# Patient Record
Sex: Male | Born: 1950 | Race: White | Hispanic: No | Marital: Single
Health system: Southern US, Community
[De-identification: ages and names within clinical notes are randomized; demographics above are authoritative.]

## PROBLEM LIST (undated history)

## (undated) DIAGNOSIS — J302 Other seasonal allergic rhinitis: Secondary | ICD-10-CM

## (undated) DIAGNOSIS — M1990 Unspecified osteoarthritis, unspecified site: Secondary | ICD-10-CM

## (undated) DIAGNOSIS — Z87442 Personal history of urinary calculi: Secondary | ICD-10-CM

## (undated) DIAGNOSIS — I10 Essential (primary) hypertension: Secondary | ICD-10-CM

## (undated) HISTORY — PX: EYE SURGERY: SHX253

## (undated) HISTORY — PX: SHOULDER SURGERY: SHX246

## (undated) HISTORY — PX: ANKLE SURGERY: SHX546

---

## 2010-04-21 ENCOUNTER — Emergency Department (HOSPITAL_COMMUNITY): Admission: EM | Admit: 2010-04-21 | Discharge: 2010-04-21 | Payer: Self-pay | Admitting: Emergency Medicine

## 2010-12-27 ENCOUNTER — Emergency Department (HOSPITAL_COMMUNITY)
Admission: EM | Admit: 2010-12-27 | Discharge: 2010-12-27 | Disposition: A | Discharge status: Home / Self Care | Payer: Worker's Compensation | Attending: Emergency Medicine | Admitting: Emergency Medicine

## 2010-12-27 ENCOUNTER — Emergency Department (HOSPITAL_COMMUNITY): Payer: Worker's Compensation

## 2010-12-27 DIAGNOSIS — Y9269 Other specified industrial and construction area as the place of occurrence of the external cause: Secondary | ICD-10-CM | POA: Insufficient documentation

## 2010-12-27 DIAGNOSIS — F17200 Nicotine dependence, unspecified, uncomplicated: Secondary | ICD-10-CM | POA: Insufficient documentation

## 2010-12-27 DIAGNOSIS — W208XXA Other cause of strike by thrown, projected or falling object, initial encounter: Secondary | ICD-10-CM | POA: Insufficient documentation

## 2010-12-27 DIAGNOSIS — S62639B Displaced fracture of distal phalanx of unspecified finger, initial encounter for open fracture: Secondary | ICD-10-CM | POA: Insufficient documentation

## 2010-12-27 DIAGNOSIS — S6710XA Crushing injury of unspecified finger(s), initial encounter: Secondary | ICD-10-CM | POA: Insufficient documentation

## 2010-12-27 DIAGNOSIS — Y990 Civilian activity done for income or pay: Secondary | ICD-10-CM | POA: Insufficient documentation

## 2010-12-27 DIAGNOSIS — Z23 Encounter for immunization: Secondary | ICD-10-CM | POA: Insufficient documentation

## 2010-12-30 NOTE — Consult Note (Signed)
NAME:  Edward Lawson, Edward Lawson NO.:  [id number]  MEDICAL RECORD NO.:  [id number]           PATIENT TYPE:  E  LOCATION:  WLED                         FACILITY:  [facility]  PHYSICIAN:  Betha Loa, MD        DATE OF BIRTH:  [birth year]  DATE OF CONSULTATION:  12/27/2010 DATE OF DISCHARGE:  12/27/2010                                CONSULTATION   Consult is from [facility] Emergency Department.  Consult is for left long fingertip crush injury.  HISTORY:  Mr. 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  He was adjusting a piece of metal and it slipped and pinched the tip of the left long finger.  He noted avulsion of the nail from the nail fold and laceration at the tip of the finger.  He came to [facility] Emergency Department for further care.  He reports no previous injuries to the left long finger or no other injuries at this time.  He was evaluated by the emergency department staff and radiographs taken.  His tetanus was updated.  He was given a gram of IV Ancef for coverage for his open fracture.  I was consulted for management of injury.  ALLERGIES:  No known drug allergies.  PAST MEDICAL HISTORY:  None.  PAST SURGICAL HISTORY:  Eye surgery as a child.  MEDICATIONS:  None.  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  He smokes one and half packs of tobacco per day for 20 years and drinks alcohol twice per week.  FAMILY HISTORY:  Noncontributory.  REVIEW SYSTEMS:  Twelve-point review of systems is negative.  PHYSICAL EXAMINATION:  VITAL SIGNS:  Temperature 98.2, pulse 73, respirations 16, blood pressure 149/80. HEAD:  Normocephalic, atraumatic. NECK:  Supple, full range of motion. EXTREMITIES:  Bilateral upper extremities are distally neurovascularly intact in radial, median, and ulnar nerve distributions. There is good sensation and capillary refill in all fingertips.  He  has had a digital block of the left long finger, but states he can still feel both the radial and ulnar sides at the tip of the finger.  Right upper extremity has no wounds, no tenderness to palpation.  Left upper extremity has no wounds, no tenderness to palpation with exception of long finger.  In the long finger, the nail is avulsed from the nail fold, there is a laceration on the radial side of the finger.  There is exposed bone.  He is able to fully extend and flex the DIP and PIP joints.  RADIOGRAPHS:  AP, lateral, and oblique views of left hand show tuft fracture of the left long finger.  No other fractures, dislocations, radiopaque foreign bodies are noted.  ASSESSMENT AND PLAN:  Left long fingertip crush injury.  I discussed with Mr. Lacasse the nature of his injury.  I recommended irrigation, debridement, and repair of the wound in the emergency department. Risks, benefits, and alternative doing so were discussed, including risk of blood loss; infection; damage to nerves, vessels, tendons, ligaments, bone; need for  additional procedures; complications with wound healing; nonunion and malunion of the fracture; and osteomyelitis.  He voiced understanding these risks and elected to proceed.  PROCEDURE NOTE:  Digital block was performed with 10 cc of 2% plain lidocaine and 5 cc of 0.25% plain Marcaine.  This was adequate to give total digital anesthesia.  The wound was irrigated with 500 cc of sterile saline and Betadine solution.  The long finger was then prepped with Betadine and draped with sterile towels.  A Penrose drain was used and tourniquet was up for less than 30 minutes.  The fingernail was removed using a freer elevator.  The wound was explored.  There was a couple of pieces of metal shaving in the wound that were removed.  The wound was debrided.  No other gross contamination was noted.  The skin laceration was repaired with 5-0 Monocryl suture in interrupted  fashion. Nail bed was repaired with 6-0 chromic gut suture in an interrupted fashion.  There was one portion of the nail bed centrally that had been shaved off.  This was able to be repaired back onto the nail bed and secured again with 6-0 chromic gut suture.  All skin edges and nail bed edges were opposed well.  A piece of Xeroform was then placed in the nail fold and all wounds were dressed with sterile Xeroform.  They were then dressed with sterile 2 x 2's and wrapped with Kling and a Coban dressing lightly.  Penrose drain was removed.  I will give him Percocet 5/325 mg 1-2 p.o. q.6 h. p.r.n. pain and dispensed #40 and Bactrim DS 1 p.o. b.i.d. for 7 days.  I will see him back in the office in 1 week for postprocedure followup.     Betha Loa, MD     KK/MEDQ  D:  12/27/2010  T:  12/28/2010  Job:  [id number]  Electronically Signed by Betha Loa  on 12/30/2010 04:15:12 PM

## 2016-05-24 ENCOUNTER — Ambulatory Visit (INDEPENDENT_AMBULATORY_CARE_PROVIDER_SITE_OTHER): Payer: Worker's Compensation

## 2016-05-24 ENCOUNTER — Ambulatory Visit (INDEPENDENT_AMBULATORY_CARE_PROVIDER_SITE_OTHER): Payer: Worker's Compensation | Admitting: Physician Assistant

## 2016-05-24 VITALS — BP 150/82 | HR 80 | Temp 98.3°F | Resp 16 | Wt 185.0 lb

## 2016-05-24 DIAGNOSIS — Z23 Encounter for immunization: Secondary | ICD-10-CM | POA: Diagnosis not present

## 2016-05-24 DIAGNOSIS — T148XXA Other injury of unspecified body region, initial encounter: Secondary | ICD-10-CM

## 2016-05-24 DIAGNOSIS — S62664B Nondisplaced fracture of distal phalanx of right ring finger, initial encounter for open fracture: Secondary | ICD-10-CM | POA: Diagnosis not present

## 2016-05-24 DIAGNOSIS — S61214A Laceration without foreign body of right ring finger without damage to nail, initial encounter: Secondary | ICD-10-CM | POA: Diagnosis not present

## 2016-05-24 DIAGNOSIS — Z026 Encounter for examination for insurance purposes: Secondary | ICD-10-CM

## 2016-05-24 MED ORDER — TRAMADOL HCL 50 MG PO TABS: 50.0000 mg | ORAL_TABLET | Freq: Three times a day (TID) | ORAL | 0 refills | Status: DC | PRN

## 2016-05-24 NOTE — Progress Notes (Signed)
Edward Lawson 10/03/1950 65 y.o.   Chief Complaint  Patient presents with  . Hand Pain    smashed right ring finger in machine at work     Date of Injury: October 18., 2018  History of Present Illness:  Presents for evaluation of work-related complaint. 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 His ring finger on his right hand was caught and crushed in the belt. He has a laceration and notes the tip of his finger wiggles when he moves it.  No decreased ROM with bending/flexing. Notes mild numbness and tingling to tip of finger. Cannot recall his last Tdap vaccination.   Review of Systems  Constitutional: Negative.   Cardiovascular: Negative for chest pain and palpitations.  Musculoskeletal: Positive for joint pain (Finger laceration right ring finger).  Neurological: Positive for sensory change (numbness right ring finger. ). Negative for dizziness and tingling.    No Known Allergies  Current medications reviewed and updated. Past medical history, family history, social history have been reviewed and updated.  Physical Exam  Constitutional: He is oriented to person, place, and time and well-developed, well-nourished, and in no distress. No distress.  Cardiovascular: Normal rate, regular rhythm and normal heart sounds.   Pulmonary/Chest: Effort normal. No respiratory distress.  Neurological: He is alert and oriented to person, place, and time. He has normal sensation. GCS score is 15.  Skin: Skin is warm and dry.  Laceration distal aspect of lateral right fourth digit. Distal aspect of finger is in tact, however mildly mobile when lightly physically encouraged side-to-side. Nail is intact, however there appears to be involvement of nailbed, as bleeding is noted from lateral aspect underneath nail. No sensory deficit. Capillary refill is <3 sec.   Psychiatric: Mood, memory, affect and judgment normal.  Vitals  reviewed.   Dg Finger Ring Right  Result Date: 05/24/2016 CLINICAL DATA:  Crush injury to right ring finger EXAM: RIGHT RING FINGER 2+V COMPARISON:  None. FINDINGS: There is an acute, comminuted, closed fracture of the distal phalanx of the right ring finger without intra-articular involvement. Fracture also involves the tuft. Adjacent soft tissue swelling and irregularity consistent with a crush injury is noted. Joint spaces are intact. IMPRESSION: Acute, comminuted, closed fracture of the distal phalanx and tuft of the right ring finger without intra-articular involvement. Electronically Signed   By: Tollie Ethavid  Lawson M.D.   On: 05/24/2016 14:52    Assessment and Plan: 1. Encounter for assessment of work-related causation of injury 2. Crush injury 3. Open nondisplaced fracture of distal phalanx of right ring finger, initial encounter 4. Laceration of right ring finger without damage to nail, foreign body presence unspecified, initial encounter - DG Finger Ring Right - Ambulatory referral to Hand Surgery - traMADol (ULTRAM) 50 MG tablet; Take 1 tablet (50 mg total) by mouth every 8 (eight) hours as needed.  Dispense: 5 tablet; Refill: 0 - It is my opinion that this injury occurred while Edward Lawson was on the job. It is my recommendation to refer to hand surgery for definitive treatment. Hand surgeon was called and consulted. He has an appointment tomorrow morning. Sterile dressing and U-splint applied and secured in place with coban. They did not recommend antibiotics at this time. Patient understands and agrees with plan.    5. Need for diphtheria-tetanus-pertussis (Tdap) vaccine - Tdap vaccine greater than or equal to 7yo IM   Edward Lawson , PA-C  [facility] May 24, 2016, 14:31

## 2016-05-24 NOTE — Patient Instructions (Addendum)
Please go to your appointment with hand surgery tomorrow at 9 am. See address and phone number below. You do not have to be fasting for this appointment. You have an open fracture of your distal ring finger. Please do not remove the splint before you get to your appointment tomorrow.   Appointment 05/25/16 at 9am Dr Mina [facility] hand specialist.  [address] [place] [id number]  [phone]   IF you received an x-ray today, you will receive an invoice from [facility]. Please contact [facility] at [phone] with questions or concerns regarding your invoice.   IF you received labwork today, you will receive an invoice from [facility]. Please contact [facility] at [phone] with questions or concerns regarding your invoice.   Our billing staff will not be able to assist you with questions regarding bills from these companies.  You will be contacted with the lab results as soon as they are available. The fastest way to get your results is to activate your My Chart account. Instructions are located on the last page of this paperwork. If you have not heard from us regarding the results in 2 weeks, please contact this office.

## 2016-10-16 ENCOUNTER — Ambulatory Visit (INDEPENDENT_AMBULATORY_CARE_PROVIDER_SITE_OTHER): Payer: Worker's Compensation

## 2016-10-16 ENCOUNTER — Encounter: Payer: Self-pay | Admitting: Family Medicine

## 2016-10-16 ENCOUNTER — Ambulatory Visit (INDEPENDENT_AMBULATORY_CARE_PROVIDER_SITE_OTHER): Payer: Worker's Compensation | Admitting: Family Medicine

## 2016-10-16 VITALS — BP 134/60 | HR 95 | Temp 97.8°F | Resp 16 | Ht 68.5 in | Wt 188.8 lb

## 2016-10-16 DIAGNOSIS — S4991XA Unspecified injury of right shoulder and upper arm, initial encounter: Secondary | ICD-10-CM

## 2016-10-16 DIAGNOSIS — Z026 Encounter for examination for insurance purposes: Secondary | ICD-10-CM

## 2016-10-16 DIAGNOSIS — M67911 Unspecified disorder of synovium and tendon, right shoulder: Secondary | ICD-10-CM | POA: Diagnosis not present

## 2016-10-16 DIAGNOSIS — M25511 Pain in right shoulder: Secondary | ICD-10-CM

## 2016-10-16 DIAGNOSIS — T148XXA Other injury of unspecified body region, initial encounter: Secondary | ICD-10-CM | POA: Diagnosis not present

## 2016-10-16 DIAGNOSIS — R202 Paresthesia of skin: Secondary | ICD-10-CM

## 2016-10-16 DIAGNOSIS — M7521 Bicipital tendinitis, right shoulder: Secondary | ICD-10-CM | POA: Diagnosis not present

## 2016-10-16 DIAGNOSIS — R200 Anesthesia of skin: Secondary | ICD-10-CM | POA: Diagnosis not present

## 2016-10-16 MED ORDER — IBUPROFEN 800 MG PO TABS: 800.0000 mg | ORAL_TABLET | Freq: Three times a day (TID) | ORAL | 0 refills | Status: DC | PRN

## 2016-10-16 MED ORDER — TRAMADOL HCL 50 MG PO TABS: 50.0000 mg | ORAL_TABLET | Freq: Three times a day (TID) | ORAL | 0 refills | Status: DC | PRN

## 2016-10-16 NOTE — Patient Instructions (Signed)
     IF you received an x-ray today, you will receive an invoice from [facility]. Please contact  Radiology at [phone] with questions or concerns regarding your invoice.   IF you received labwork today, you will receive an invoice from [facility]. Please contact [facility] at [phone] with questions or concerns regarding your invoice.   Our billing staff will not be able to assist you with questions regarding bills from these companies.  You will be contacted with the lab results as soon as they are available. The fastest way to get your results is to activate your My Chart account. Instructions are located on the last page of this paperwork. If you have not heard from us regarding the results in 2 weeks, please contact this office.     

## 2016-10-16 NOTE — Progress Notes (Signed)
Edward Lawson Nov 10, 1950 66 y.o.   Chief Complaint  Patient presents with  . worker's comp    injury to RIGHT SHOULDER x 3 weeks ago 09/25/2016    Presents for evaluation of work-related complaint.  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 He reports that he felt a sharp burning pain in the right shoulder right away. Like a rip. He reports that he took pain pills. He reports that every night he has been woken up with pain every morning. He states that this is the first time he is being evaluated.  He reports that he was told that he did not have the money to pay upfront for the medical services.  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  He reports that he gets numbness as well   Review of Systems  Constitutional: Negative for chills, fever and weight loss.  Eyes: Negative for blurred vision and double vision.  Respiratory: Negative for cough, shortness of breath and wheezing.   Cardiovascular: Negative for chest pain, orthopnea and leg swelling.  Gastrointestinal: Negative for abdominal pain, nausea and vomiting.  Musculoskeletal: Positive for joint pain and myalgias. Negative for back pain and neck pain.  Skin: Negative for itching and rash.  Neurological: Negative for dizziness, tingling and headaches.  Psychiatric/Behavioral: Negative for depression. The patient is not nervous/anxious.      Current medications and allergies reviewed and updated. Past medical history, family history, social history have been reviewed and updated.   Physical Exam  Constitutional: He is oriented to person, place, and time and well-developed, well-nourished, and in no distress.  HENT:  Head: Normocephalic and atraumatic.  Nose: Nose normal.  Mouth/Throat:  Oropharynx is clear and moist.  Eyes: Conjunctivae and EOM are normal.  Cardiovascular: Normal rate, regular rhythm, normal heart sounds and intact distal pulses.   Pulmonary/Chest: Effort normal and breath sounds normal. No respiratory distress. He has no wheezes. He has no rales. He exhibits no tenderness.  Abdominal: Soft. Bowel sounds are normal.  Musculoskeletal:  Pt unable to abduct past 15 degrees without pain and tremor Painful arc sign Weakness with external rotation Tenderness over palpitation of the proximal biceps tendon and over the deltoid anteriorly  Neurological: He is alert and oriented to person, place, and time. He has normal reflexes. Coordination normal.  Skin: Skin is warm. No rash noted. No erythema.  Psychiatric: Mood, memory, affect and judgment normal.    RIGHT SHOULDER - 2+ VIEW  COMPARISON:  None.  FINDINGS: No fracture. No dislocation at the right glenohumeral joint. No evidence of right acromioclavicular joint separation. 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 Tiny calcification adjacent to the right greater tuberosity. No suspicious focal osseous lesion.  IMPRESSION: 1. No fracture or malalignment in the right shoulder. 2. Tiny calcification adjacent to the greater tuberosity compatible with calcific tendinopathy/bursitis.   Electronically Signed   By: Delbert Phenix M.D.   On: 10/16/2016 09:36  Assessment and Plan: Edward Lawson was seen today for worker's comp.  Diagnoses and all orders for this visit:  Encounter for assessment of work-related causation of injury Acute pain of right shoulder Injury of right shoulder, initial encounter Numbness and tingling in right hand -  DG Shoulder Right -     Ambulatory referral to Orthopedic Surgery  Rotator cuff disorder, right -     Ambulatory referral to Orthopedic Surgery -     traMADol (ULTRAM) 50 MG tablet; Take 1 tablet (50 mg total) by mouth every 8 (eight) hours as  needed.  Biceps tendinitis of right upper extremity -     Ambulatory referral to Orthopedic Surgery  Other orders -     ibuprofen (ADVIL,MOTRIN) 800 MG tablet; Take 1 tablet (800 mg total) by mouth every 8 (eight) hours as needed. With food.  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

## 2016-10-18 DIAGNOSIS — M67911 Unspecified disorder of synovium and tendon, right shoulder: Secondary | ICD-10-CM | POA: Insufficient documentation

## 2016-10-20 ENCOUNTER — Telehealth: Payer: Self-pay | Admitting: Family Medicine

## 2016-10-20 NOTE — Telephone Encounter (Signed)
Clydie Braun from [facility] left a VM asking for more info regarding ortho referral. Called back with no answer and was unable to leave VM.  [phone]

## 2016-11-16 ENCOUNTER — Other Ambulatory Visit: Payer: Self-pay | Admitting: Family Medicine

## 2016-11-17 NOTE — Telephone Encounter (Signed)
10/16/16 last refill 

## 2016-12-06 ENCOUNTER — Other Ambulatory Visit: Payer: Self-pay | Admitting: Family Medicine

## 2016-12-07 NOTE — Telephone Encounter (Signed)
11/18/16 last refill 10/2016 last ov

## 2016-12-08 NOTE — Telephone Encounter (Signed)
refilled 

## 2017-01-04 ENCOUNTER — Other Ambulatory Visit: Payer: Self-pay | Admitting: Family Medicine

## 2017-07-09 DIAGNOSIS — S43421D Sprain of right rotator cuff capsule, subsequent encounter: Secondary | ICD-10-CM | POA: Diagnosis not present

## 2017-07-14 DIAGNOSIS — M25511 Pain in right shoulder: Secondary | ICD-10-CM | POA: Diagnosis not present

## 2017-07-19 DIAGNOSIS — S43421D Sprain of right rotator cuff capsule, subsequent encounter: Secondary | ICD-10-CM | POA: Diagnosis not present

## 2017-08-14 DIAGNOSIS — S46011A Strain of muscle(s) and tendon(s) of the rotator cuff of right shoulder, initial encounter: Secondary | ICD-10-CM | POA: Diagnosis not present

## 2017-08-14 DIAGNOSIS — X58XXXA Exposure to other specified factors, initial encounter: Secondary | ICD-10-CM | POA: Diagnosis not present

## 2017-08-14 DIAGNOSIS — M75121 Complete rotator cuff tear or rupture of right shoulder, not specified as traumatic: Secondary | ICD-10-CM | POA: Diagnosis not present

## 2017-08-14 DIAGNOSIS — Y999 Unspecified external cause status: Secondary | ICD-10-CM | POA: Diagnosis not present

## 2017-08-14 DIAGNOSIS — M7541 Impingement syndrome of right shoulder: Secondary | ICD-10-CM | POA: Diagnosis not present

## 2017-08-14 DIAGNOSIS — G8918 Other acute postprocedural pain: Secondary | ICD-10-CM | POA: Diagnosis not present

## 2017-09-14 DIAGNOSIS — M25511 Pain in right shoulder: Secondary | ICD-10-CM | POA: Diagnosis not present

## 2017-09-17 DIAGNOSIS — M25511 Pain in right shoulder: Secondary | ICD-10-CM | POA: Diagnosis not present

## 2017-09-20 DIAGNOSIS — M25511 Pain in right shoulder: Secondary | ICD-10-CM | POA: Diagnosis not present

## 2017-09-24 DIAGNOSIS — M25511 Pain in right shoulder: Secondary | ICD-10-CM | POA: Diagnosis not present

## 2017-09-27 DIAGNOSIS — M25511 Pain in right shoulder: Secondary | ICD-10-CM | POA: Diagnosis not present

## 2017-10-08 DIAGNOSIS — M25511 Pain in right shoulder: Secondary | ICD-10-CM | POA: Diagnosis not present

## 2017-10-11 DIAGNOSIS — M25511 Pain in right shoulder: Secondary | ICD-10-CM | POA: Diagnosis not present

## 2018-01-27 IMAGING — DX DG FINGER RING 2+V*R*
3 series · 3 of 3 positions shown · non-contrast
Comparison: None.

CLINICAL DATA: Crush injury to right ring finger

EXAM:
RIGHT RING FINGER 2+V

[finger ap]
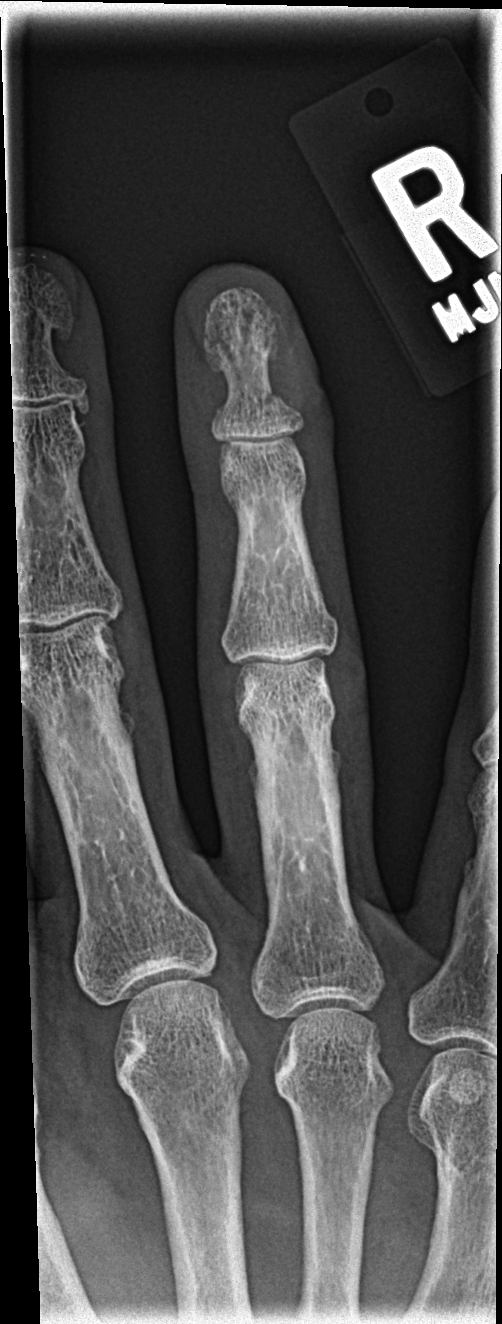

[finger obl]
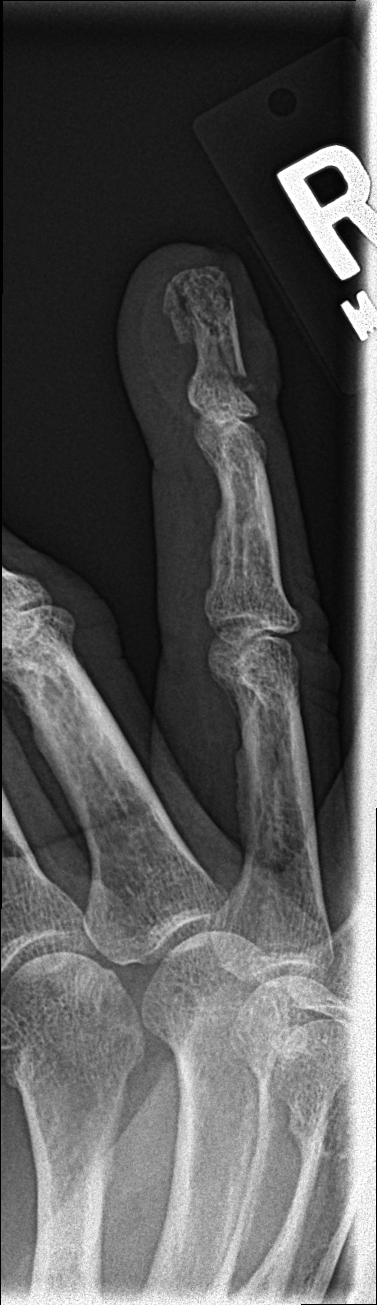

[finger lat]
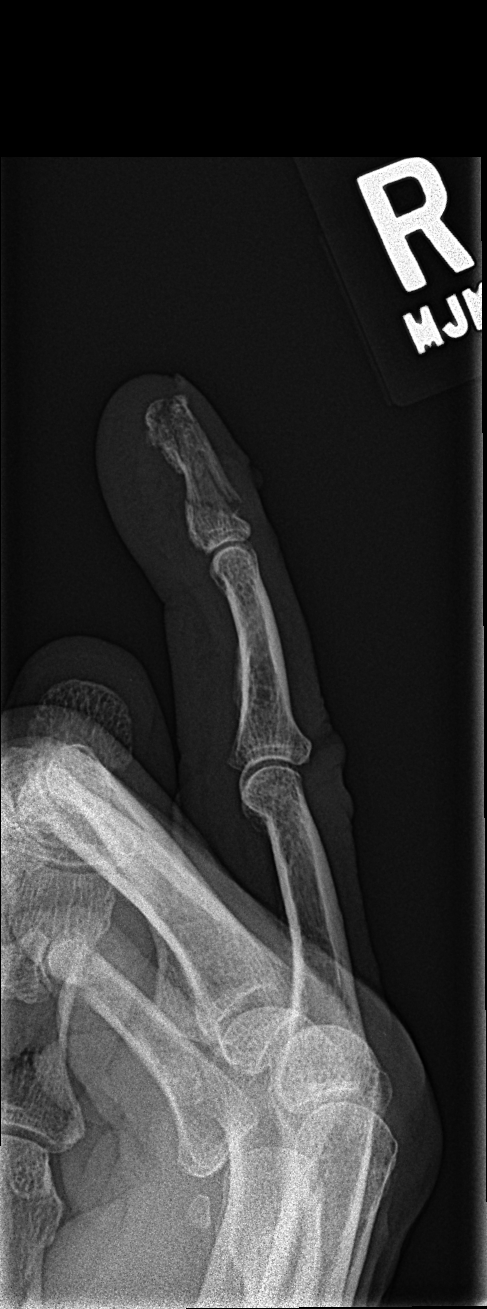

[3 of 3 positions shown; findings below may reference images not displayed]

FINDINGS: There is an acute, comminuted, closed fracture of the distal phalanx
of the right ring finger without intra-articular involvement.
Fracture also involves the tuft. Adjacent soft tissue swelling and
irregularity consistent with a crush injury is noted. Joint spaces
are intact.
IMPRESSION: Acute, comminuted, closed fracture of the distal phalanx and tuft of
the right ring finger without intra-articular involvement.

## 2018-06-21 IMAGING — DX DG SHOULDER 2+V*R*
3 series · 3 of 3 positions shown · non-contrast
Comparison: None.

CLINICAL DATA: Right shoulder pain after injury

EXAM:
RIGHT SHOULDER - 2+ VIEW

[shoulder ap]
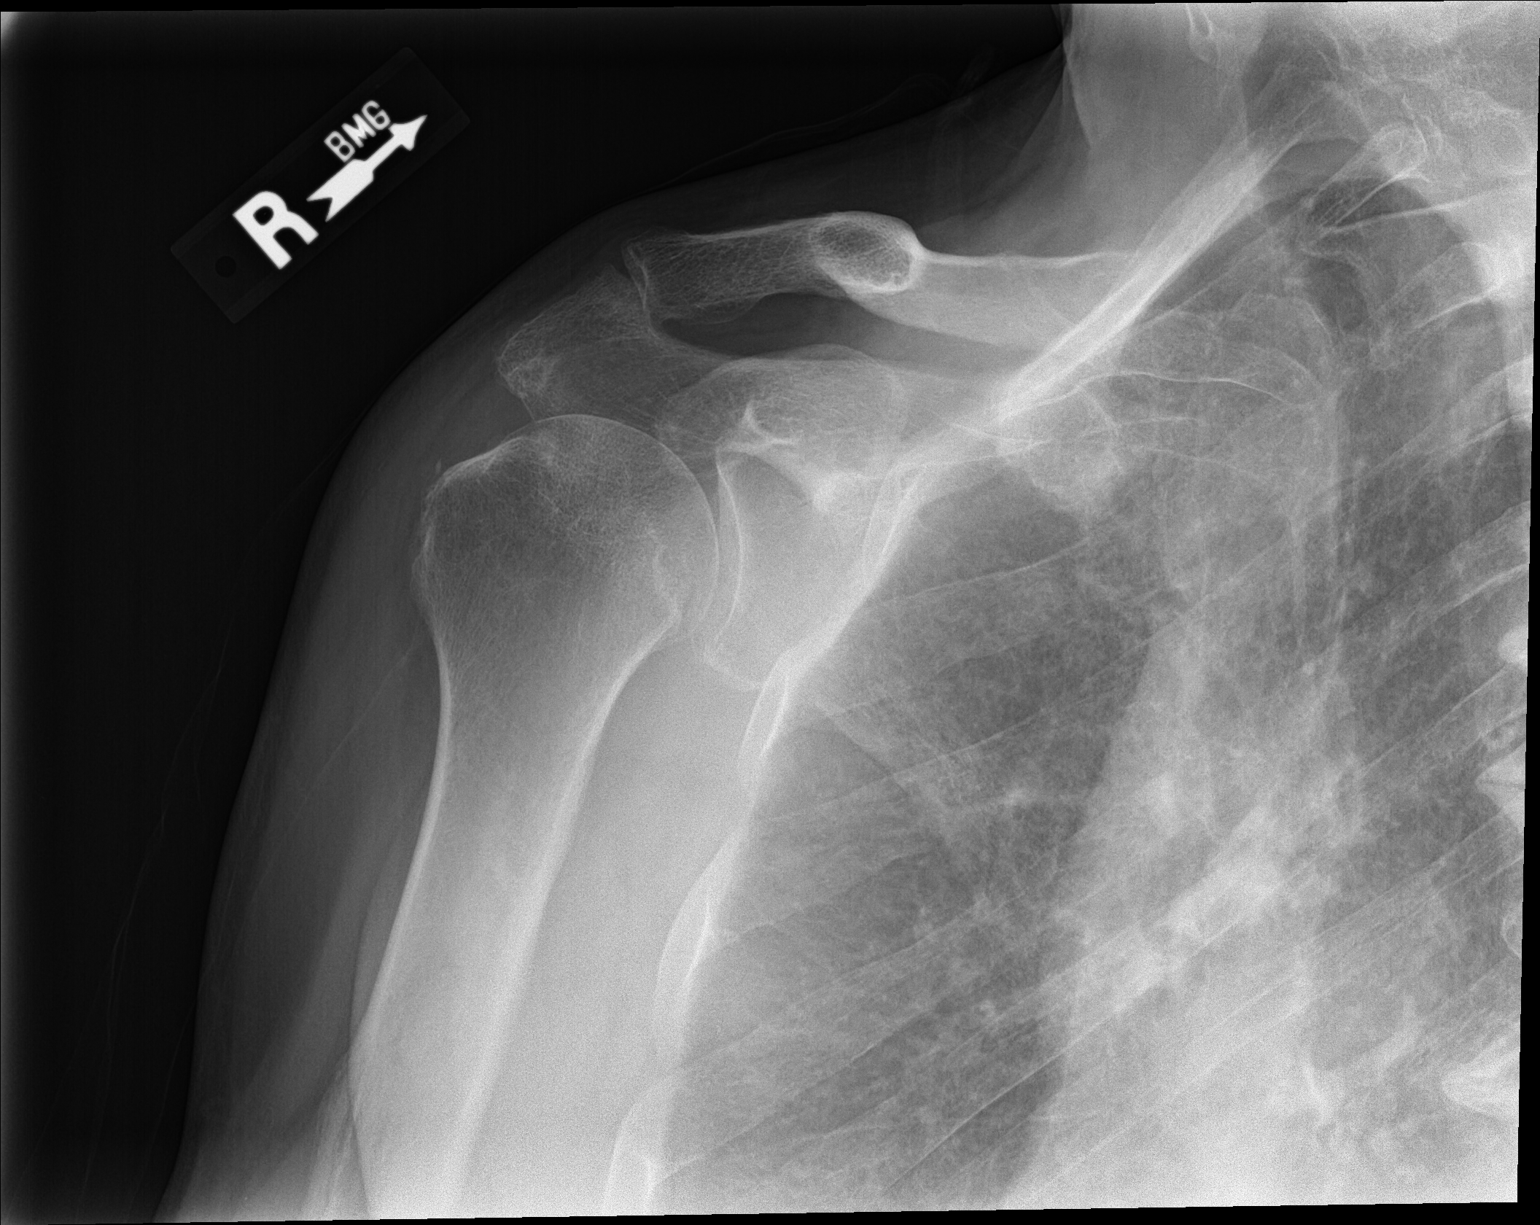

[shoulder y-view]
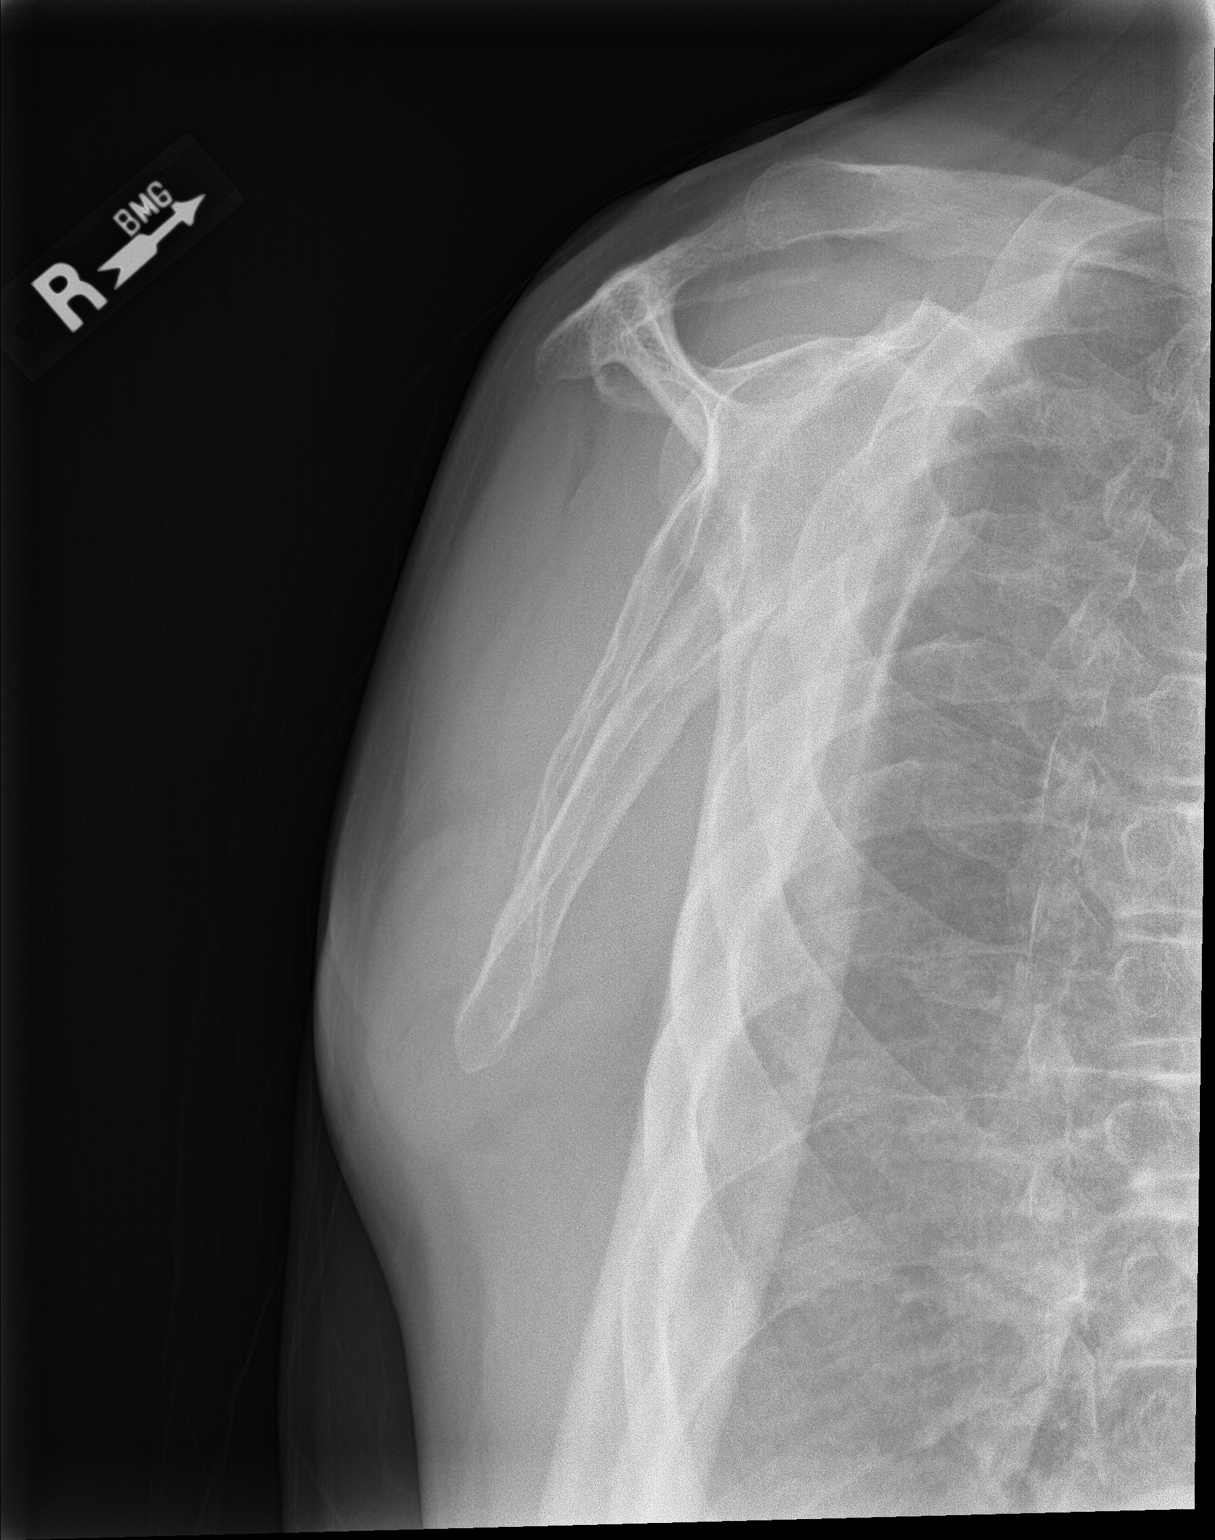

[shoulder axial]
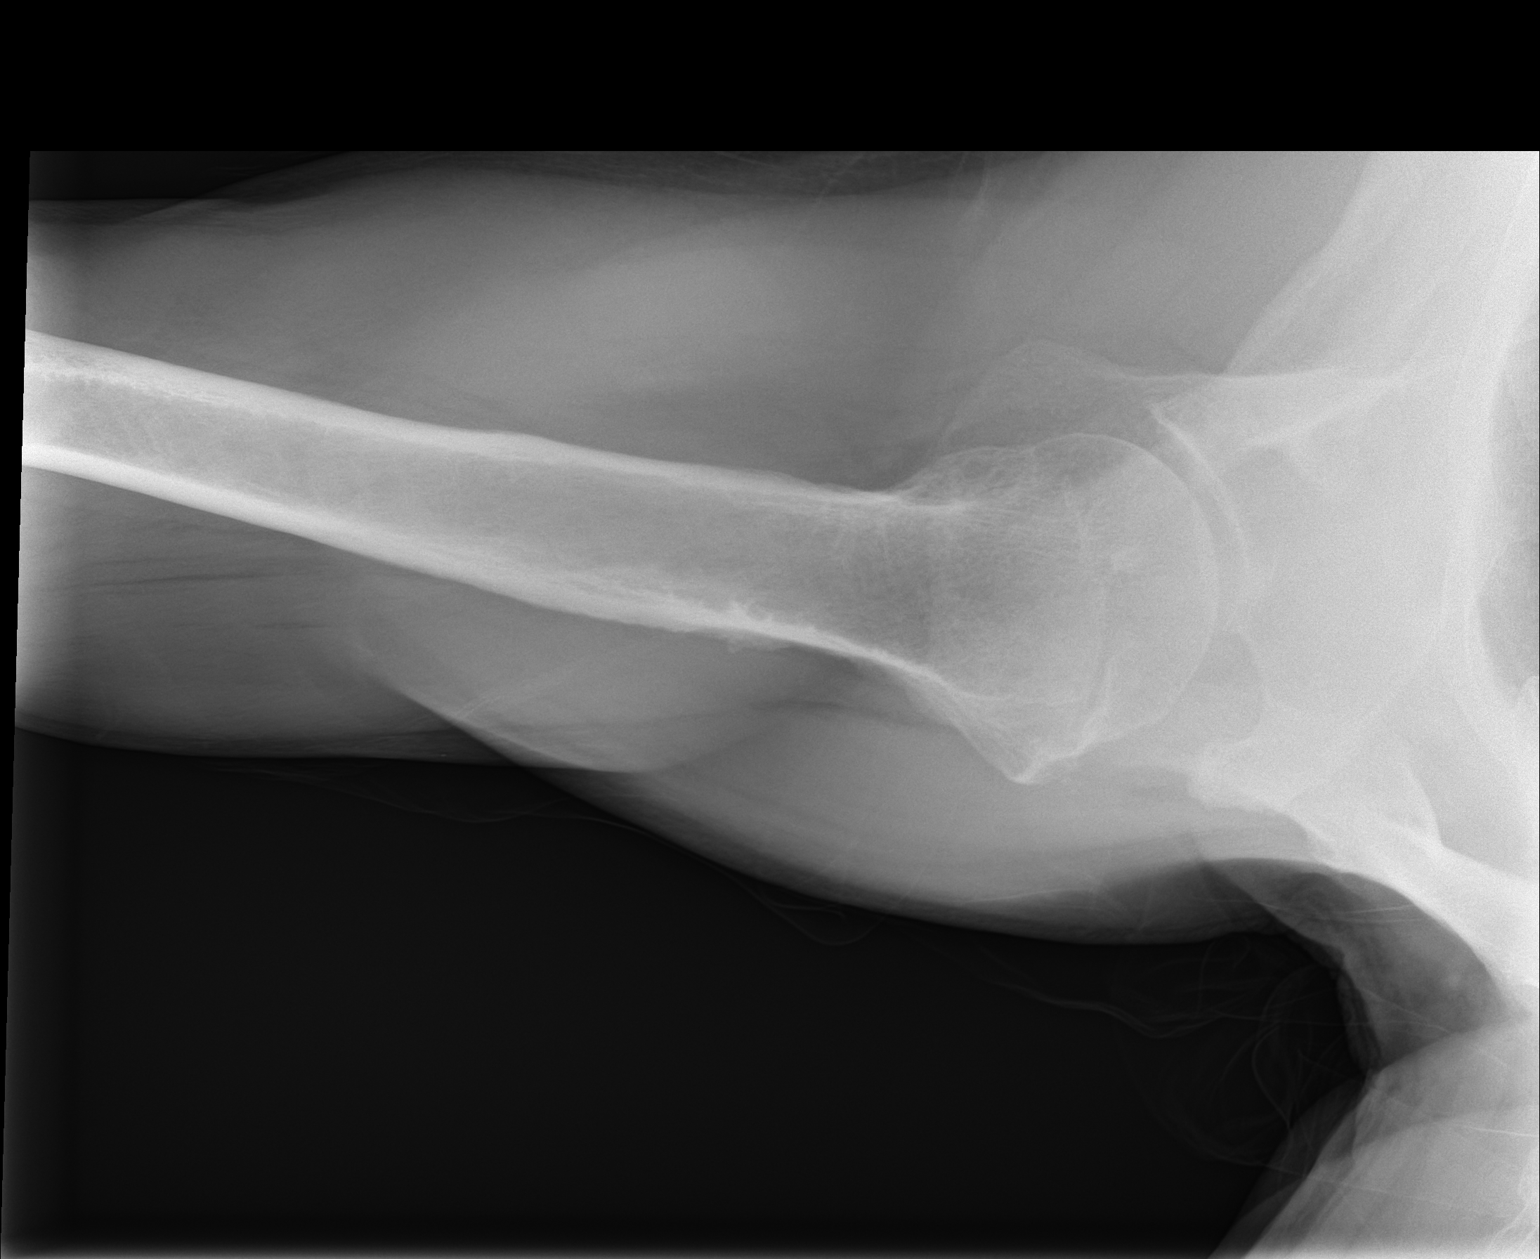

[3 of 3 positions shown; findings below may reference images not displayed]

FINDINGS: No fracture. No dislocation at the right glenohumeral joint. No
evidence of right acromioclavicular joint separation. Mild
osteoarthritis at the right acromioclavicular joint. Tiny
calcification adjacent to the right greater tuberosity. No
suspicious focal osseous lesion.
IMPRESSION: 1. No fracture or malalignment in the right shoulder.
2. Tiny calcification adjacent to the greater tuberosity compatible
with calcific tendinopathy/bursitis.

## 2021-02-22 DIAGNOSIS — L720 Epidermal cyst: Secondary | ICD-10-CM | POA: Diagnosis not present

## 2021-03-30 DIAGNOSIS — L723 Sebaceous cyst: Secondary | ICD-10-CM | POA: Diagnosis not present

## 2021-05-10 ENCOUNTER — Ambulatory Visit: Payer: Self-pay | Admitting: General Surgery

## 2021-05-10 NOTE — Progress Notes (Signed)
Surgical Instructions    Your procedure is scheduled on Tuesday, October 11th.  [facility] Entr[facility]" at 7:30 A.M., then check in with the Admitting office.  Call this number if you have problems the morning of surgery:  [phone]   If you have any questions prior to your surgery date call [phone]: Open Monday-Friday 8am-4pm    Remember:  Do not eat after midnight the night before your surgery  You may drink clear liquids until 6:30 AM the morning of your surgery.   Clear liquids allowed are: Water, Non-Citrus Juices (without pulp), Carbonated Beverages, Clear Tea, Black Coffee ONLY (NO MILK, CREAM OR POWDERED CREAMER of any kind), and Gatorade    Take these medicines the morning of surgery with A SIP OF WATER   NONE   As of today, STOP taking any Aspirin (unless otherwise instructed by your surgeon) Aleve, Naproxen, Ibuprofen, Motrin, Advil, Goody's, BC's, all herbal medications, fish oil, and all vitamins.          Do not wear jewelry  Do not wear lotions, powders, colognes, or deodorant. Men may shave face and neck. Do not bring valuables to the hospital.             [facility] is not responsible for any belongings or valuables.  Do NOT Smoke (Tobacco/Vaping)  24 hours prior to your procedure If you use a CPAP at night, you may bring your mask for your overnight stay.   Contacts, glasses, dentures or bridgework may not be worn into surgery, please bring cases for these belongings   For patients admitted to the hospital, discharge time will be determined by your treatment team.   Patients discharged the day of surgery will not be allowed to drive home, and someone needs to stay with them for 24 hours.  NO VISITORS WILL BE ALLOWED IN PRE-OP WHERE PATIENTS ARE PREPPED FOR SURGERY.  ONLY 1 SUPPORT PERSON MAY BE PRESENT IN THE WAITING ROOM WHILE YOU ARE IN SURGERY.  IF YOU ARE TO BE ADMITTED, ONCE YOU ARE IN YOUR ROOM YOU WILL BE ALLOWED TWO (2)  VISITORS. 1 (ONE) VISITOR MAY STAY OVERNIGHT BUT MUST ARRIVE TO THE ROOM BY 8pm.  Minor children may have two parents present. Special consideration for safety and communication needs will be reviewed on a case by case basis.  Special instructions:    Oral Hygiene is also important to reduce your risk of infection.  Remember - BRUSH YOUR TEETH THE MORNING OF SURGERY WITH YOUR REGULAR TOOTHPASTE   - Preparing For Surgery  Before surgery, you can play an important role. Because skin is not sterile, your skin needs to be as free of germs as possible. You can reduce the number of germs on your skin by washing with CHG (chlorahexidine gluconate) Soap before surgery.  CHG is an antiseptic cleaner which kills germs and bonds with the skin to continue killing germs even after washing.     Please do not use if you have an allergy to CHG or antibacterial soaps. If your skin becomes reddened/irritated stop using the CHG.  Do not shave (including legs and underarms) for at least 48 hours prior to first CHG shower. It is OK to shave your face.  Please follow these instructions carefully.     Shower the NIGHT BEFORE SURGERY and the MORNING OF SURGERY with CHG Soap.   If you chose to wash your hair, wash your hair first as usual with your normal  shampoo. After you shampoo, rinse your hair and body thoroughly to remove the shampoo.  Then [employer] and genitals (private parts) with your normal soap and rinse thoroughly to remove soap.  After that Use CHG Soap as you would any other liquid soap. You can apply CHG directly to the skin and wash gently with a scrungie or a clean washcloth.   Apply the CHG Soap to your body ONLY FROM THE NECK DOWN.  Do not use on open wounds or open sores. Avoid contact with your eyes, ears, mouth and genitals (private parts). Wash Face and genitals (private parts)  with your normal soap.   Wash thoroughly, paying special attention to the area where your surgery will  be performed.  Thoroughly rinse your body with warm water from the neck down.  DO NOT shower/wash with your normal soap after using and rinsing off the CHG Soap.  Pat yourself dry with a CLEAN TOWEL.  Wear CLEAN PAJAMAS to bed the night before surgery  Place CLEAN SHEETS on your bed the night before your surgery  DO NOT SLEEP WITH PETS.   Day of Surgery:  Take a shower with CHG soap. Wear Clean/Comfortable clothing the morning of surgery Do not apply any deodorants/lotions.   Remember to brush your teeth WITH YOUR REGULAR TOOTHPASTE.   Please read over the following fact sheets that you were given.

## 2021-05-11 ENCOUNTER — Encounter (HOSPITAL_COMMUNITY)
Admission: RE | Admit: 2021-05-11 | Discharge: 2021-05-11 | Disposition: A | Discharge status: Home / Self Care | Payer: PPO | Source: Ambulatory Visit | Attending: General Surgery | Admitting: General Surgery

## 2021-05-11 ENCOUNTER — Encounter (HOSPITAL_COMMUNITY): Payer: Self-pay

## 2021-05-11 ENCOUNTER — Other Ambulatory Visit: Payer: Self-pay

## 2021-05-11 DIAGNOSIS — E119 Type 2 diabetes mellitus without complications: Secondary | ICD-10-CM | POA: Insufficient documentation

## 2021-05-11 DIAGNOSIS — Z01812 Encounter for preprocedural laboratory examination: Secondary | ICD-10-CM | POA: Diagnosis not present

## 2021-05-11 HISTORY — DX: Unspecified osteoarthritis, unspecified site: M19.90

## 2021-05-11 HISTORY — DX: Personal history of urinary calculi: Z87.442

## 2021-05-11 LAB — CBC
HCT: 51.3 % (ref 39.0–52.0)
Hemoglobin: 16.7 g/dL (ref 13.0–17.0)
MCH: 31.4 pg (ref 26.0–34.0)
MCHC: 32.6 g/dL (ref 30.0–36.0)
MCV: 96.4 fL (ref 80.0–100.0)
Platelets: 275 10*3/uL (ref 150–400)
RBC: 5.32 MIL/uL (ref 4.22–5.81)
RDW: 12.6 % (ref 11.5–15.5)
WBC: 11.9 10*3/uL — ABNORMAL HIGH (ref 4.0–10.5)
nRBC: 0 % (ref 0.0–0.2)

## 2021-05-11 LAB — COMPREHENSIVE METABOLIC PANEL
ALT: 30 U/L (ref 0–44)
AST: 21 U/L (ref 15–41)
Albumin: 3.9 g/dL (ref 3.5–5.0)
Alkaline Phosphatase: 78 U/L (ref 38–126)
Anion gap: 8 (ref 5–15)
BUN: 9 mg/dL (ref 8–23)
CO2: 28 mmol/L (ref 22–32)
Calcium: 9 mg/dL (ref 8.9–10.3)
Chloride: 100 mmol/L (ref 98–111)
Creatinine, Ser: 0.81 mg/dL (ref 0.61–1.24)
GFR, Estimated: >60 mL/min (ref >60)
Glucose, Bld: 119 mg/dL — ABNORMAL HIGH (ref 70–99)
Potassium: 4.5 mmol/L (ref 3.5–5.1)
Sodium: 136 mmol/L (ref 135–145)
Total Bilirubin: 0.9 mg/dL (ref 0.3–1.2)
Total Protein: 7.5 g/dL (ref 6.5–8.1)

## 2021-05-11 LAB — GLUCOSE, CAPILLARY: Glucose-Capillary: 340 mg/dL — ABNORMAL HIGH (ref 70–99)

## 2021-05-11 NOTE — Progress Notes (Signed)
PCP - denies Cardiologist - denies  Chest x-ray - n/a EKG - n/a  ERAS Protcol - yes, no drink ordered or given   COVID TEST- n/a (ambulatory)   Anesthesia review: n/a  At PAT appointment, patient stated he has 13 broken teeth.  Patient does not see dentist.  Last tooth he extracted himself, about 5 months ago. Revonda Standard notified.  No new orders given at this time.    Patient denies shortness of breath, fever, cough and chest pain at PAT appointment   All instructions explained to the patient, with a verbal understanding of the material. Patient agrees to go over the instructions while at home for a better understanding. Patient also instructed to self quarantine after being tested for COVID-19. The opportunity to ask questions was provided.

## 2021-05-17 ENCOUNTER — Encounter (HOSPITAL_COMMUNITY)
Admission: RE | Disposition: A | Discharge status: Home / Self Care | Payer: Self-pay | Source: Home / Self Care | Attending: General Surgery

## 2021-05-17 ENCOUNTER — Ambulatory Visit (HOSPITAL_COMMUNITY)
Admission: RE | Admit: 2021-05-17 | Discharge: 2021-05-17 | Disposition: A | Discharge status: Home / Self Care | Payer: PPO | Attending: General Surgery | Admitting: General Surgery

## 2021-05-17 ENCOUNTER — Ambulatory Visit (HOSPITAL_COMMUNITY): Payer: PPO | Admitting: General Practice

## 2021-05-17 ENCOUNTER — Other Ambulatory Visit: Payer: Self-pay

## 2021-05-17 ENCOUNTER — Ambulatory Visit (HOSPITAL_COMMUNITY): Payer: PPO | Admitting: Vascular Surgery

## 2021-05-17 ENCOUNTER — Encounter (HOSPITAL_COMMUNITY): Payer: Self-pay | Admitting: General Surgery

## 2021-05-17 DIAGNOSIS — L720 Epidermal cyst: Secondary | ICD-10-CM | POA: Diagnosis not present

## 2021-05-17 DIAGNOSIS — M1990 Unspecified osteoarthritis, unspecified site: Secondary | ICD-10-CM | POA: Diagnosis not present

## 2021-05-17 DIAGNOSIS — E119 Type 2 diabetes mellitus without complications: Secondary | ICD-10-CM | POA: Diagnosis not present

## 2021-05-17 DIAGNOSIS — Z87442 Personal history of urinary calculi: Secondary | ICD-10-CM | POA: Diagnosis not present

## 2021-05-17 DIAGNOSIS — L723 Sebaceous cyst: Secondary | ICD-10-CM | POA: Diagnosis not present

## 2021-05-17 DIAGNOSIS — F17210 Nicotine dependence, cigarettes, uncomplicated: Secondary | ICD-10-CM | POA: Insufficient documentation

## 2021-05-17 HISTORY — PX: EXCISION OF KELOID: SHX6267

## 2021-05-17 LAB — GLUCOSE, CAPILLARY: Glucose-Capillary: 134 mg/dL — ABNORMAL HIGH (ref 70–99)

## 2021-05-17 SURGERY — EXCISION, KELOID
Anesthesia: General | Laterality: Left

## 2021-05-17 MED ORDER — CHLORHEXIDINE GLUCONATE CLOTH 2 % EX PADS: 6.0000 | MEDICATED_PAD | Freq: Once | CUTANEOUS | Status: DC

## 2021-05-17 MED ORDER — FENTANYL CITRATE (PF) 250 MCG/5ML IJ SOLN
INTRAMUSCULAR | Status: AC
  Filled 2021-05-17: qty 5

## 2021-05-17 MED ORDER — GABAPENTIN 300 MG PO CAPS
300.0000 mg | ORAL_CAPSULE | ORAL | Status: AC
  Administered 2021-05-17: 300 mg via ORAL
  Filled 2021-05-17: qty 1

## 2021-05-17 MED ORDER — SODIUM CHLORIDE 0.9 % IV SOLN: INTRAVENOUS | Status: DC | PRN

## 2021-05-17 MED ORDER — DEXAMETHASONE SODIUM PHOSPHATE 10 MG/ML IJ SOLN
INTRAMUSCULAR | Status: DC | PRN
  Administered 2021-05-17: 10 mg via INTRAVENOUS

## 2021-05-17 MED ORDER — LACTATED RINGERS IV SOLN: INTRAVENOUS | Status: DC

## 2021-05-17 MED ORDER — ORAL CARE MOUTH RINSE: 15.0000 mL | Freq: Once | OROMUCOSAL | Status: AC

## 2021-05-17 MED ORDER — PROPOFOL 10 MG/ML IV BOLUS
INTRAVENOUS | Status: AC
  Filled 2021-05-17: qty 20

## 2021-05-17 MED ORDER — CHLORHEXIDINE GLUCONATE 0.12 % MT SOLN
15.0000 mL | Freq: Once | OROMUCOSAL | Status: AC
  Administered 2021-05-17: 15 mL via OROMUCOSAL
  Filled 2021-05-17: qty 15

## 2021-05-17 MED ORDER — FENTANYL CITRATE (PF) 100 MCG/2ML IJ SOLN: 25.0000 ug | INTRAMUSCULAR | Status: DC | PRN

## 2021-05-17 MED ORDER — TRAMADOL HCL 50 MG PO TABS: 50.0000 mg | ORAL_TABLET | Freq: Four times a day (QID) | ORAL | 0 refills | Status: AC | PRN

## 2021-05-17 MED ORDER — BUPIVACAINE-EPINEPHRINE 0.5% -1:200000 IJ SOLN
INTRAMUSCULAR | Status: AC
  Filled 2021-05-17: qty 1

## 2021-05-17 MED ORDER — ONDANSETRON HCL 4 MG/2ML IJ SOLN
INTRAMUSCULAR | Status: DC | PRN
  Administered 2021-05-17: 4 mg via INTRAVENOUS

## 2021-05-17 MED ORDER — ACETAMINOPHEN 500 MG PO TABS
1000.0000 mg | ORAL_TABLET | ORAL | Status: AC
Start: 2021-05-17 — End: 2021-05-17
  Administered 2021-05-17: 1000 mg via ORAL
  Filled 2021-05-17: qty 2

## 2021-05-17 MED ORDER — SUGAMMADEX SODIUM 200 MG/2ML IV SOLN
INTRAVENOUS | Status: DC | PRN
  Administered 2021-05-17: 200 mg via INTRAVENOUS

## 2021-05-17 MED ORDER — 0.9 % SODIUM CHLORIDE (POUR BTL) OPTIME
TOPICAL | Status: DC | PRN
  Administered 2021-05-17: 1000 mL

## 2021-05-17 MED ORDER — PHENYLEPHRINE 40 MCG/ML (10ML) SYRINGE FOR IV PUSH (FOR BLOOD PRESSURE SUPPORT)
PREFILLED_SYRINGE | INTRAVENOUS | Status: DC | PRN
  Administered 2021-05-17 (×3): 80 ug via INTRAVENOUS

## 2021-05-17 MED ORDER — DEXAMETHASONE SODIUM PHOSPHATE 10 MG/ML IJ SOLN
INTRAMUSCULAR | Status: AC
  Filled 2021-05-17: qty 1

## 2021-05-17 MED ORDER — MIDAZOLAM HCL 2 MG/2ML IJ SOLN
INTRAMUSCULAR | Status: DC | PRN
  Administered 2021-05-17: 2 mg via INTRAVENOUS

## 2021-05-17 MED ORDER — ONDANSETRON HCL 4 MG/2ML IJ SOLN
INTRAMUSCULAR | Status: AC
  Filled 2021-05-17: qty 2

## 2021-05-17 MED ORDER — BUPIVACAINE-EPINEPHRINE 0.5% -1:200000 IJ SOLN
INTRAMUSCULAR | Status: DC | PRN
  Administered 2021-05-17: 30 mL

## 2021-05-17 MED ORDER — LIDOCAINE 2% (20 MG/ML) 5 ML SYRINGE
INTRAMUSCULAR | Status: DC | PRN
  Administered 2021-05-17: 100 mg via INTRAVENOUS

## 2021-05-17 MED ORDER — PROPOFOL 10 MG/ML IV BOLUS
INTRAVENOUS | Status: DC | PRN
  Administered 2021-05-17: 160 mg via INTRAVENOUS

## 2021-05-17 MED ORDER — MIDAZOLAM HCL 2 MG/2ML IJ SOLN
INTRAMUSCULAR | Status: AC
  Filled 2021-05-17: qty 2

## 2021-05-17 MED ORDER — PHENYLEPHRINE 40 MCG/ML (10ML) SYRINGE FOR IV PUSH (FOR BLOOD PRESSURE SUPPORT)
PREFILLED_SYRINGE | INTRAVENOUS | Status: AC
  Filled 2021-05-17: qty 10

## 2021-05-17 MED ORDER — ROCURONIUM BROMIDE 10 MG/ML (PF) SYRINGE
PREFILLED_SYRINGE | INTRAVENOUS | Status: AC
  Filled 2021-05-17: qty 10

## 2021-05-17 MED ORDER — PHENYLEPHRINE HCL-NACL 20-0.9 MG/250ML-% IV SOLN
INTRAVENOUS | Status: DC | PRN
  Administered 2021-05-17: 25 ug/min via INTRAVENOUS

## 2021-05-17 MED ORDER — SUCCINYLCHOLINE CHLORIDE 200 MG/10ML IV SOSY
PREFILLED_SYRINGE | INTRAVENOUS | Status: DC | PRN
  Administered 2021-05-17: 140 mg via INTRAVENOUS

## 2021-05-17 MED ORDER — LIDOCAINE 2% (20 MG/ML) 5 ML SYRINGE
INTRAMUSCULAR | Status: AC
  Filled 2021-05-17: qty 5

## 2021-05-17 MED ORDER — CEFAZOLIN SODIUM-DEXTROSE 2-4 GM/100ML-% IV SOLN
2.0000 g | INTRAVENOUS | Status: AC
  Administered 2021-05-17: 2 g via INTRAVENOUS
  Filled 2021-05-17: qty 100

## 2021-05-17 MED ORDER — FENTANYL CITRATE (PF) 250 MCG/5ML IJ SOLN
INTRAMUSCULAR | Status: DC | PRN
  Administered 2021-05-17 (×2): 50 ug via INTRAVENOUS
  Administered 2021-05-17: 100 ug via INTRAVENOUS

## 2021-05-17 MED ORDER — ROCURONIUM BROMIDE 10 MG/ML (PF) SYRINGE
PREFILLED_SYRINGE | INTRAVENOUS | Status: DC | PRN
Start: 2021-05-17 — End: 2021-05-17
  Administered 2021-05-17: 20 mg via INTRAVENOUS
  Administered 2021-05-17: 50 mg via INTRAVENOUS

## 2021-05-17 SURGICAL SUPPLY — 35 items
BAG COUNTER SPONGE SURGICOUNT (BAG) ×2 IMPLANT
CANISTER SUCT 3000ML PPV (MISCELLANEOUS) ×2 IMPLANT
CHLORAPREP W/TINT 26 (MISCELLANEOUS) ×4 IMPLANT
COVER SURGICAL LIGHT HANDLE (MISCELLANEOUS) ×2 IMPLANT
DERMABOND ADVANCED (GAUZE/BANDAGES/DRESSINGS) ×2
DERMABOND ADVANCED .7 DNX12 (GAUZE/BANDAGES/DRESSINGS) ×2 IMPLANT
DRAPE LAPAROTOMY 100X72 PEDS (DRAPES) ×2 IMPLANT
DRAPE ORTHO SPLIT 77X108 STRL (DRAPES) ×2
DRAPE SURG ORHT 6 SPLT 77X108 (DRAPES) ×2 IMPLANT
ELECT REM PT RETURN 9FT ADLT (ELECTROSURGICAL) ×2
ELECTRODE REM PT RTRN 9FT ADLT (ELECTROSURGICAL) ×1 IMPLANT
GAUZE 4X4 16PLY ~~LOC~~+RFID DBL (SPONGE) ×2 IMPLANT
GLOVE SRG 8 PF TXTR STRL LF DI (GLOVE) ×1 IMPLANT
GLOVE SURG ENC MOIS LTX SZ8 (GLOVE) ×2 IMPLANT
GLOVE SURG UNDER POLY LF SZ8 (GLOVE) ×1
GOWN STRL REUS W/ TWL LRG LVL3 (GOWN DISPOSABLE) ×1 IMPLANT
GOWN STRL REUS W/ TWL XL LVL3 (GOWN DISPOSABLE) ×1 IMPLANT
GOWN STRL REUS W/TWL LRG LVL3 (GOWN DISPOSABLE) ×1
GOWN STRL REUS W/TWL XL LVL3 (GOWN DISPOSABLE) ×1
KIT BASIN OR (CUSTOM PROCEDURE TRAY) ×2 IMPLANT
KIT TURNOVER KIT B (KITS) ×2 IMPLANT
NEEDLE HYPO 25X1 1.5 SAFETY (NEEDLE) ×2 IMPLANT
NS IRRIG 1000ML POUR BTL (IV SOLUTION) ×2 IMPLANT
PACK GENERAL/GYN (CUSTOM PROCEDURE TRAY) ×2 IMPLANT
PAD ARMBOARD 7.5X6 YLW CONV (MISCELLANEOUS) ×2 IMPLANT
PENCIL SMOKE EVACUATOR (MISCELLANEOUS) ×2 IMPLANT
SPECIMEN JAR SMALL (MISCELLANEOUS) ×6 IMPLANT
SPONGE T-LAP 18X18 ~~LOC~~+RFID (SPONGE) ×4 IMPLANT
SUT MNCRL AB 4-0 PS2 18 (SUTURE) ×6 IMPLANT
SUT VIC AB 3-0 SH 27 (SUTURE) ×3
SUT VIC AB 3-0 SH 27X BRD (SUTURE) ×3 IMPLANT
SYR CONTROL 10ML LL (SYRINGE) ×2 IMPLANT
TOWEL GREEN STERILE (TOWEL DISPOSABLE) ×2 IMPLANT
TOWEL GREEN STERILE FF (TOWEL DISPOSABLE) ×2 IMPLANT
YANKAUER SUCT BULB TIP NO VENT (SUCTIONS) ×2 IMPLANT

## 2021-05-17 NOTE — Anesthesia Procedure Notes (Signed)
Procedure Name: Intubation Date/Time: 05/17/2021 9:38 AM Performed by: Ardyth Harps, CRNA Pre-anesthesia Checklist: Patient identified, Emergency Drugs available, Suction available and Patient being monitored Patient Re-evaluated:Patient Re-evaluated prior to induction Oxygen Delivery Method: Circle System Utilized Preoxygenation: Pre-oxygenation with 100% oxygen Induction Type: IV induction Ventilation: Mask ventilation without difficulty Laryngoscope Size: Mac and 4 Grade View: Grade I Tube type: Oral Tube size: 7.5 mm Number of attempts: 1 Airway Equipment and Method: Stylet and Oral airway Placement Confirmation: ETT inserted through vocal cords under direct vision, positive ETCO2 and breath sounds checked- equal and bilateral Secured at: 23 cm Tube secured with: Tape Dental Injury: Teeth and Oropharynx as per pre-operative assessment

## 2021-05-17 NOTE — H&P (Signed)
Edward Lawson is an 70 y.o. male.   Chief Complaint: sebaceous cyst posterior neck, back, and left chest HPI: 70 year old male presents for excision of sebaceous cyst posterior neck, back, and left chest.  He feels his chest cyst has gotten a little larger since I saw him in the office.  No other changes.  Past Medical History:  Diagnosis Date   Arthritis    History of kidney stones     Past Surgical History:  Procedure Laterality Date   ANKLE SURGERY Bilateral    Broken Ankles   EYE SURGERY     SHOULDER SURGERY Right     History reviewed. No pertinent family history. Social History:  reports that he has been smoking cigarettes. He has been smoking an average of 2 packs per day. He has never used smokeless tobacco. He reports current alcohol use of about 24.0 standard drinks per week. He reports that he does not use drugs.  Allergies: No Known Allergies  Medications Prior to Admission  Medication Sig Dispense Refill   ibuprofen (ADVIL) 200 MG tablet Take 800 mg by mouth every 8 (eight) hours as needed for moderate pain.      Results for orders placed or performed during the hospital encounter of 05/17/21 (from the past 48 hour(s))  Glucose, capillary     Status: Abnormal   Collection Time: 05/17/21  8:00 AM  Result Value Ref Range   Glucose-Capillary 134 (H) 70 - 99 mg/dL    Comment: Glucose reference range applies only to samples taken after fasting for at least 8 hours.   No results found.  Review of Systems  Blood pressure (!) 149/77, pulse 70, temperature 98 F (36.7 C), temperature source Oral, resp. rate 17, height 5\' 11"  (1.803 m), weight 92.5 kg, SpO2 95 %. Physical Exam Constitutional:      Appearance: Normal appearance.  HENT:     Head: Normocephalic.     Nose: Nose normal.     Mouth/Throat:     Mouth: Mucous membranes are moist.  Neck:     Comments: 4 cm sebaceous cyst posterior neck along the midline Cardiovascular:     Rate and Rhythm: Normal rate and  regular rhythm.  Pulmonary:     Effort: Pulmonary effort is normal.     Breath sounds: Normal breath sounds.     Comments: 2 cm sebaceous cyst left chest over pectoralis Abdominal:     General: Abdomen is flat.     Palpations: Abdomen is soft.  Musculoskeletal:        General: Normal range of motion.     Cervical back: Neck supple.     Comments: Lobulated 6 cm sebaceous cyst mid back midline  Skin:    General: Skin is warm.  Neurological:     Mental Status: He is alert and oriented to person, place, and time.  Psychiatric:        Mood and Affect: Mood normal.     Assessment/Plan Sebaceous cyst posterior neck, back, and left chest.  Plan excision of all 3.  Procedure, risks, and benefits were discussed in detail with him.  He is agreeable.  , MD 05/17/2021, 8:54 AM

## 2021-05-17 NOTE — Op Note (Addendum)
05/17/2021  11:44 AM  PATIENT:  Edward Lawson  70 y.o. male  PRE-OPERATIVE DIAGNOSIS:  SEBACEOUS CYST POSTERIOR NECK 4CM, SEBACEOUS CYST BACK 6CM AND SEBACEOUS CYST LEFT CHEST 2CM  POST-OPERATIVE DIAGNOSIS:  SEBACEOUS CYST POSTERIOR NECK 4CM, SEBACEOUS CYST BACK 6CM AND SEBACEOUS CYST LEFT CHEST 2CM  PROCEDURE:  Procedure(s): EXCISION OF SEBACEOUS CYST POSTERIOR NECK 4CM, SEBACEOUS CYST BACK 6CM AND SEBACEOUS CYST LEFT CHEST 2CM - all with layered closure  SURGEON:  Surgeon(s): Violeta Gelinas, MD  ASSISTANTS: Tresa Moore, MD   ANESTHESIA:   local and general  EBL:  Total I/O In: -  Out: 10 [Blood:10]  BLOOD ADMINISTERED:none  DRAINS: none   SPECIMEN:  Excision  DISPOSITION OF SPECIMEN:  PATHOLOGY  COUNTS:  YES  DICTATION: .Dragon Dictation Procedure in detail: Informed consent was obtained.  His sites were all marked.  He received intravenous antibiotics.  He was brought to the operating room and general endotracheal anesthesia was administered by the anesthesia staff.  He was placed in prone position with appropriate padding and positioning.  His posterior neck and posterior back were prepped and draped in the sterile fashion.  Timeout procedure was performed.  Attention was focused to the back cyst which appeared to be 2 connected together.  Local was injected all around the area.  A vertical incision was made.  Subcutaneous tissues were dissected down revealing the cyst.  The cyst was circumferentially dissected and was actually 2 cysts connected together.  They were totally dissected out using cautery and excised.  All portions of the cyst wall were removed.  Wound was irrigated and hemostasis was obtained.  We changed our gloves and the wound was closed in layers with deep dermal tissues approximated with interrupted 3-0 Vicryl and the skin closed with running 4-0 Monocryl followed by Dermabond.  Next, attention was directed to the posterior neck cyst.  Local was injected  again.  A transverse incision was made.  Subcutaneous tissues were dissected down and the cyst was totally excised.  It was also lobular.  Hemostasis was obtained.  The area was copiously irrigated.  We changed our gloves.  We then excised the lower edge of the skin in order to facilitate closure.  The wound was closed using 3-0 Vicryl to approximate the deep dermal tissues and and the skin closed with running 4-0 Monocryl followed by Dermabond.  Patient was then positioned supine.  We reprepped and draped his chest.  We did another timeout procedure.  Local was injected around this cyst on his left chest.  A small incision was made.  Subcutaneous tissues were dissected down using cautery and the cyst was located.  It was circumferentially dissected and removed in its entirety.  Subcutaneous tissues were irrigated.  We ensured hemostasis.  We changed our gloves.  The wound was closed using 3-0 Vicryl to approximate the deep dermal tissues and and the skin closed with running 4-0 Monocryl followed by Dermabond.  All counts were correct.  He tolerated the procedure well without apparent complication and was taken recovery in stable condition.  I was personally present during the key and critical portions of this procedure and immediately available throughout the entire procedure, as documented in my operative note.  PATIENT DISPOSITION:  PACU - hemodynamically stable.   Delay start of Pharmacological VTE agent (>24hrs) due to surgical blood loss or risk of bleeding:  no  Violeta Gelinas, MD, MPH, FACS Pager: [phone]  10/11/202211:44 AM

## 2021-05-17 NOTE — Anesthesia Preprocedure Evaluation (Signed)
Anesthesia Evaluation  Patient identified by MRN, date of birth, ID band Patient awake    Reviewed: Allergy & Precautions, NPO status , Patient's Chart, lab work & pertinent test results  Airway Mallampati: II  TM Distance: >3 FB     Dental   Pulmonary Current Smoker and Patient abstained from smoking.,    breath sounds clear to auscultation       Cardiovascular negative cardio ROS   Rhythm:Regular Rate:Normal     Neuro/Psych    GI/Hepatic negative GI ROS, Neg liver ROS,   Endo/Other  negative endocrine ROS  Renal/GU negative Renal ROS     Musculoskeletal   Abdominal   Peds  Hematology   Anesthesia Other Findings   Reproductive/Obstetrics                             Anesthesia Physical Anesthesia Plan  ASA: 3  Anesthesia Plan: General   Post-op Pain Management:    Induction: Intravenous  PONV Risk Score and Plan: 1 and Ondansetron, Dexamethasone and Midazolam  Airway Management Planned: Oral ETT  Additional Equipment:   Intra-op Plan:   Post-operative Plan: Extubation in OR  Informed Consent: I have reviewed the patients History and Physical, chart, labs and discussed the procedure including the risks, benefits and alternatives for the proposed anesthesia with the patient or authorized representative who has indicated his/her understanding and acceptance.     Dental advisory given  Plan Discussed with: CRNA and Anesthesiologist  Anesthesia Plan Comments:         Anesthesia Quick Evaluation

## 2021-05-17 NOTE — Anesthesia Postprocedure Evaluation (Signed)
Anesthesia Post Note  Patient: Edward Lawson  Procedure(s) Performed: EXCISION SEBACEOUS CYST POSTERIOR NECK AND BACK, EXCISION SEBACEOUS CYST LEFT CHEST (Left)     Patient location during evaluation: PACU Anesthesia Type: General Level of consciousness: awake Pain management: pain level controlled Vital Signs Assessment: post-procedure vital signs reviewed and stable Respiratory status: spontaneous breathing Cardiovascular status: stable Postop Assessment: no apparent nausea or vomiting Anesthetic complications: no   No notable events documented.  Last Vitals:  Vitals:   05/17/21 1200 05/17/21 1215  BP: 132/75 122/72  Pulse: 69 72  Resp: 13 17  Temp:    SpO2: 94% 93%    Last Pain:  Vitals:   05/17/21 1215  TempSrc:   PainSc: 0-No pain                  

## 2021-05-17 NOTE — Transfer of Care (Signed)
Immediate Anesthesia Transfer of Care Note  Patient: Edward Lawson  Procedure(s) Performed: EXCISION SEBACEOUS CYST POSTERIOR NECK AND BACK, EXCISION SEBACEOUS CYST LEFT CHEST (Left)  Patient Location: PACU  Anesthesia Type:General  Level of Consciousness: drowsy  Airway & Oxygen Therapy: Patient Spontanous Breathing and Patient connected to face mask oxygen  Post-op Assessment: Report given to RN and Post -op Vital signs reviewed and stable  Post vital signs: Reviewed and stable  Last Vitals:  Vitals Value Taken Time  BP 113/52 05/17/21 1144  Temp    Pulse 66 05/17/21 1146  Resp 19 05/17/21 1146  SpO2 94 % 05/17/21 1146  Vitals shown include unvalidated device data.  Last Pain:  Vitals:   05/17/21 0752  TempSrc:   PainSc: 2       Patients Stated Pain Goal: 0 (05/17/21 0752)  Complications: No notable events documented.

## 2021-05-18 ENCOUNTER — Encounter (HOSPITAL_COMMUNITY): Payer: Self-pay | Admitting: General Surgery

## 2021-05-18 LAB — SURGICAL PATHOLOGY

## 2023-03-31 ENCOUNTER — Emergency Department (HOSPITAL_COMMUNITY): Payer: Medicare HMO

## 2023-03-31 ENCOUNTER — Emergency Department (HOSPITAL_COMMUNITY)
Admission: EM | Admit: 2023-03-31 | Discharge: 2023-03-31 | Disposition: A | Discharge status: Home / Self Care | Payer: Medicare HMO | Attending: Emergency Medicine | Admitting: Emergency Medicine

## 2023-03-31 DIAGNOSIS — Y92410 Unspecified street and highway as the place of occurrence of the external cause: Secondary | ICD-10-CM | POA: Insufficient documentation

## 2023-03-31 DIAGNOSIS — R911 Solitary pulmonary nodule: Secondary | ICD-10-CM | POA: Diagnosis not present

## 2023-03-31 DIAGNOSIS — I10 Essential (primary) hypertension: Secondary | ICD-10-CM | POA: Insufficient documentation

## 2023-03-31 DIAGNOSIS — Z79899 Other long term (current) drug therapy: Secondary | ICD-10-CM | POA: Diagnosis not present

## 2023-03-31 DIAGNOSIS — N2889 Other specified disorders of kidney and ureter: Secondary | ICD-10-CM | POA: Diagnosis not present

## 2023-03-31 DIAGNOSIS — S32030A Wedge compression fracture of third lumbar vertebra, initial encounter for closed fracture: Secondary | ICD-10-CM | POA: Insufficient documentation

## 2023-03-31 DIAGNOSIS — R918 Other nonspecific abnormal finding of lung field: Secondary | ICD-10-CM

## 2023-03-31 DIAGNOSIS — M549 Dorsalgia, unspecified: Secondary | ICD-10-CM | POA: Diagnosis present

## 2023-03-31 LAB — I-STAT CHEM 8, ED
BUN: 15 mg/dL (ref 8–23)
Calcium, Ion: 1.03 mmol/L — ABNORMAL LOW (ref 1.15–1.40)
Chloride: 105 mmol/L (ref 98–111)
Creatinine, Ser: 1.1 mg/dL (ref 0.61–1.24)
Glucose, Bld: 104 mg/dL — ABNORMAL HIGH (ref 70–99)
HCT: 42 % (ref 39.0–52.0)
Hemoglobin: 14.3 g/dL (ref 13.0–17.0)
Potassium: 3.7 mmol/L (ref 3.5–5.1)
Sodium: 137 mmol/L (ref 135–145)
TCO2: 18 mmol/L — ABNORMAL LOW (ref 22–32)

## 2023-03-31 LAB — TYPE AND SCREEN
ABO/RH(D): O POS
Antibody Screen: NEGATIVE

## 2023-03-31 LAB — CBC
HCT: 42.6 % (ref 39.0–52.0)
Hemoglobin: 14 g/dL (ref 13.0–17.0)
MCH: 31.7 pg (ref 26.0–34.0)
MCHC: 32.9 g/dL (ref 30.0–36.0)
MCV: 96.6 fL (ref 80.0–100.0)
Platelets: 242 10*3/uL (ref 150–400)
RBC: 4.41 MIL/uL (ref 4.22–5.81)
RDW: 12.3 % (ref 11.5–15.5)
WBC: 14.4 10*3/uL — ABNORMAL HIGH (ref 4.0–10.5)
nRBC: 0 % (ref 0.0–0.2)

## 2023-03-31 LAB — ABO/RH: ABO/RH(D): O POS

## 2023-03-31 LAB — PROTIME-INR
INR: 1.1 (ref 0.8–1.2)
Prothrombin Time: 14.1 s (ref 11.4–15.2)

## 2023-03-31 LAB — COMPREHENSIVE METABOLIC PANEL
ALT: 24 U/L (ref 0–44)
AST: 26 U/L (ref 15–41)
Albumin: 3.6 g/dL (ref 3.5–5.0)
Alkaline Phosphatase: 63 U/L (ref 38–126)
Anion gap: 16 — ABNORMAL HIGH (ref 5–15)
BUN: 15 mg/dL (ref 8–23)
CO2: 19 mmol/L — ABNORMAL LOW (ref 22–32)
Calcium: 8.4 mg/dL — ABNORMAL LOW (ref 8.9–10.3)
Chloride: 101 mmol/L (ref 98–111)
Creatinine, Ser: 0.97 mg/dL (ref 0.61–1.24)
GFR, Estimated: >60 mL/min (ref >60)
Glucose, Bld: 107 mg/dL — ABNORMAL HIGH (ref 70–99)
Potassium: 3.6 mmol/L (ref 3.5–5.1)
Sodium: 136 mmol/L (ref 135–145)
Total Bilirubin: 0.6 mg/dL (ref 0.3–1.2)
Total Protein: 6.6 g/dL (ref 6.5–8.1)

## 2023-03-31 LAB — ETHANOL: Alcohol, Ethyl (B): 128 mg/dL — ABNORMAL HIGH (ref <10)

## 2023-03-31 LAB — I-STAT CG4 LACTIC ACID, ED: Lactic Acid, Venous: 2.4 mmol/L (ref 0.5–1.9)

## 2023-03-31 MED ORDER — IOHEXOL 350 MG/ML SOLN
75.0000 mL | Freq: Once | INTRAVENOUS | Status: AC | PRN
  Administered 2023-03-31: 75 mL via INTRAVENOUS

## 2023-03-31 MED ORDER — KETOROLAC TROMETHAMINE 15 MG/ML IJ SOLN
15.0000 mg | Freq: Once | INTRAMUSCULAR | Status: AC
  Administered 2023-03-31: 15 mg via INTRAVENOUS
  Filled 2023-03-31: qty 1
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
## 2023-03-31 NOTE — Discharge Instructions (Signed)
Your CT scan showed that you broke L3 which is a spinal fracture.  Follow-up with the neurosurgeon listed.  Your CT scans also shows that you have a mass on your right kidney that is concerning for cancer.  You need to follow-up both with your primary care physician and I am also referring you to an oncologist.  There are also some spots in your lungs that are concerning.  If you develop new or worsening pain, weakness or numbness in your legs, incontinence, or any other new/concerning symptoms then return to the ER or call 911.

## 2023-03-31 NOTE — ED Notes (Signed)
Ortho tech arrived delivered back brace. Reports device has been fitted appropriately and ready for placement when Pt request/discharged.

## 2023-03-31 NOTE — Progress Notes (Signed)
Orthopedic Tech Progress Note Patient Details:  Edward Lawson 07-12-1951 [id number] LSO brace was sized to patient. Patient was not ready to have the brace applied at this time and would like to wait until he is able to get out of the bed. Patient stated that he understood how to apply the brace to himself. If he needs assistance please reach out.  Ortho Devices Type of Ortho Device: Lumbar corsett Ortho Device/Splint Location: Lumbar spine Ortho Device/Splint Interventions: Ordered, Adjustment   Post Interventions Instructions Provided: Adjustment of device  Katerina Zurn E Tashara Suder 03/31/2023, 6:50 PM
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
## 2023-04-10 ENCOUNTER — Encounter: Payer: Self-pay | Admitting: Physician Assistant

## 2023-04-10 ENCOUNTER — Inpatient Hospital Stay: Payer: Medicare HMO

## 2023-04-10 ENCOUNTER — Inpatient Hospital Stay: Payer: Medicare HMO | Attending: Physician Assistant | Admitting: Physician Assistant

## 2023-04-10 VITALS — BP 151/76 | HR 75 | Temp 98.5°F | Resp 18 | Ht 70.0 in | Wt 193.2 lb

## 2023-04-10 DIAGNOSIS — F17210 Nicotine dependence, cigarettes, uncomplicated: Secondary | ICD-10-CM | POA: Diagnosis not present

## 2023-04-10 DIAGNOSIS — D49511 Neoplasm of unspecified behavior of right kidney: Secondary | ICD-10-CM | POA: Diagnosis not present

## 2023-04-10 DIAGNOSIS — Z79899 Other long term (current) drug therapy: Secondary | ICD-10-CM | POA: Diagnosis not present

## 2023-04-10 DIAGNOSIS — M549 Dorsalgia, unspecified: Secondary | ICD-10-CM | POA: Diagnosis not present

## 2023-04-10 DIAGNOSIS — R918 Other nonspecific abnormal finding of lung field: Secondary | ICD-10-CM | POA: Diagnosis not present

## 2023-04-10 DIAGNOSIS — J439 Emphysema, unspecified: Secondary | ICD-10-CM | POA: Insufficient documentation

## 2023-04-10 DIAGNOSIS — N2889 Other specified disorders of kidney and ureter: Secondary | ICD-10-CM | POA: Diagnosis not present

## 2023-04-10 DIAGNOSIS — K5903 Drug induced constipation: Secondary | ICD-10-CM | POA: Diagnosis not present

## 2023-04-10 DIAGNOSIS — R350 Frequency of micturition: Secondary | ICD-10-CM | POA: Diagnosis not present

## 2023-04-10 LAB — CBC WITH DIFFERENTIAL (CANCER CENTER ONLY)
Abs Immature Granulocytes: 0.03 10*3/uL (ref 0.00–0.07)
Basophils Absolute: 0.1 10*3/uL (ref 0.0–0.1)
Basophils Relative: 1 %
Eosinophils Absolute: 0.2 10*3/uL (ref 0.0–0.5)
Eosinophils Relative: 2 %
HCT: 43.8 % (ref 39.0–52.0)
Hemoglobin: 14.6 g/dL (ref 13.0–17.0)
Immature Granulocytes: 0 %
Lymphocytes Relative: 26 %
Lymphs Abs: 3.5 10*3/uL (ref 0.7–4.0)
MCH: 31.4 pg (ref 26.0–34.0)
MCHC: 33.3 g/dL (ref 30.0–36.0)
MCV: 94.2 fL (ref 80.0–100.0)
Monocytes Absolute: 1.2 10*3/uL — ABNORMAL HIGH (ref 0.1–1.0)
Monocytes Relative: 9 %
Neutro Abs: 8.8 10*3/uL — ABNORMAL HIGH (ref 1.7–7.7)
Neutrophils Relative %: 62 %
Platelet Count: 347 10*3/uL (ref 150–400)
RBC: 4.65 MIL/uL (ref 4.22–5.81)
RDW: 12.4 % (ref 11.5–15.5)
WBC Count: 13.9 10*3/uL — ABNORMAL HIGH (ref 4.0–10.5)
nRBC: 0 % (ref 0.0–0.2)

## 2023-04-10 LAB — IRON AND IRON BINDING CAPACITY (CC-WL,HP ONLY)
Iron: 76 ug/dL (ref 45–182)
Saturation Ratios: 20 % (ref 17.9–39.5)
TIBC: 382 ug/dL (ref 250–450)
UIBC: 306 ug/dL (ref 117–376)

## 2023-04-10 LAB — CMP (CANCER CENTER ONLY)
ALT: 17 U/L (ref 0–44)
AST: 16 U/L (ref 15–41)
Albumin: 4.4 g/dL (ref 3.5–5.0)
Alkaline Phosphatase: 97 U/L (ref 38–126)
Anion gap: 8 (ref 5–15)
BUN: 14 mg/dL (ref 8–23)
CO2: 28 mmol/L (ref 22–32)
Calcium: 9.5 mg/dL (ref 8.9–10.3)
Chloride: 102 mmol/L (ref 98–111)
Creatinine: 0.73 mg/dL (ref 0.61–1.24)
GFR, Estimated: >60 mL/min (ref >60)
Glucose, Bld: 100 mg/dL — ABNORMAL HIGH (ref 70–99)
Potassium: 3.7 mmol/L (ref 3.5–5.1)
Sodium: 138 mmol/L (ref 135–145)
Total Bilirubin: 0.8 mg/dL (ref 0.3–1.2)
Total Protein: 7.9 g/dL (ref 6.5–8.1)

## 2023-04-10 LAB — URINALYSIS, COMPLETE (UACMP) WITH MICROSCOPIC
Bacteria, UA: NONE SEEN
Bilirubin Urine: NEGATIVE
Glucose, UA: NEGATIVE mg/dL
Hgb urine dipstick: NEGATIVE
Ketones, ur: NEGATIVE mg/dL
Leukocytes,Ua: NEGATIVE
Nitrite: NEGATIVE
Protein, ur: NEGATIVE mg/dL
Specific Gravity, Urine: 1.018 (ref 1.005–1.030)
pH: 5 (ref 5.0–8.0)

## 2023-04-10 LAB — LACTATE DEHYDROGENASE: LDH: 157 U/L (ref 98–192)

## 2023-04-11 ENCOUNTER — Telehealth: Payer: Self-pay

## 2023-04-11 LAB — FERRITIN: Ferritin: 105 ng/mL (ref 24–336)

## 2023-04-11 NOTE — Telephone Encounter (Signed)
Please send urgent referral to [facility] for renal mass   IT  STAT referral faxed and confirmation received

## 2023-04-18 ENCOUNTER — Telehealth: Payer: Self-pay

## 2023-04-18 NOTE — Telephone Encounter (Signed)
-----   Message from Briant Cedar sent at 04/18/2023  7:04 AM EDT ----- Please notify patient that labs look good except for slight elevation in his WBC. Can be secondary to his smoking. Can you confirm if he has any infectious symptoms (cough, fever, chills, etc)? ----- Message ----- From: Interface, Lab In [place] Sent: 04/10/2023   3:19 PM EDT To: Briant Cedar, PA-C

## 2023-04-18 NOTE — Telephone Encounter (Signed)
Pt advised of lab results.  He is not having any s/s of coughing, fever or chills.  He said he has been under a lot of stress and having a lot of back pain which he was seen for yesterday  PET scan is scheduled for 9/17

## 2023-04-24 ENCOUNTER — Encounter (HOSPITAL_COMMUNITY)
Admission: RE | Admit: 2023-04-24 | Discharge: 2023-04-24 | Disposition: A | Discharge status: Home / Self Care | Payer: Medicare HMO | Source: Ambulatory Visit | Attending: Physician Assistant | Admitting: Physician Assistant

## 2023-04-24 DIAGNOSIS — R918 Other nonspecific abnormal finding of lung field: Secondary | ICD-10-CM | POA: Insufficient documentation

## 2023-04-24 DIAGNOSIS — N2889 Other specified disorders of kidney and ureter: Secondary | ICD-10-CM | POA: Diagnosis present

## 2023-04-24 DIAGNOSIS — I700 Atherosclerosis of aorta: Secondary | ICD-10-CM | POA: Diagnosis not present

## 2023-04-24 DIAGNOSIS — I2510 Atherosclerotic heart disease of native coronary artery without angina pectoris: Secondary | ICD-10-CM | POA: Diagnosis not present

## 2023-04-24 LAB — GLUCOSE, CAPILLARY: Glucose-Capillary: 114 mg/dL — ABNORMAL HIGH (ref 70–99)

## 2023-04-24 MED ORDER — FLUDEOXYGLUCOSE F - 18 (FDG) INJECTION
9.2500 | Freq: Once | INTRAVENOUS | Status: AC
  Administered 2023-04-24: 9.25 via INTRAVENOUS

## 2023-04-30 ENCOUNTER — Telehealth: Payer: Self-pay | Admitting: Physician Assistant

## 2023-04-30 DIAGNOSIS — R918 Other nonspecific abnormal finding of lung field: Secondary | ICD-10-CM

## 2023-04-30 NOTE — Telephone Encounter (Signed)
I called Mr. Edward Lawson to review the PET scan results from 04/24/2023. Findings show solid right renal mass consistent with RCC. There is one LUL lung nodule showing mild hypermetabolic activity favoring infectious/inflammatory process and would be challenging location for biopsy. Remaining lung nodules are below PET resolution.   Recommend repeat CT chest in 3 months for monitor the lung nodules. He is schedule for urology consultation on 05/03/2023 to evaluate the renal mass.   Patient expressed understanding of the plan provided.

## 2023-05-02 ENCOUNTER — Telehealth: Payer: Self-pay | Admitting: Hematology and Oncology

## 2023-05-09 ENCOUNTER — Other Ambulatory Visit: Payer: Self-pay | Admitting: Urology

## 2023-06-04 NOTE — Progress Notes (Signed)
Friend of pt called and stated pt was confused in regards to preop appt.  Friend could verify DOB and pt was having surgery.  Instructed friend pt is to arrive at 1030am for 1100 preop on 06/05/23 and pt does not have to be fasting and ot birng insurance care and picture ID.  Friend voiced understanding

## 2023-06-04 NOTE — Progress Notes (Addendum)
COVID Vaccine Completed:  Date of COVID positive in last 90 days:  PCP - Antony Haste, MD Cardiologist -   PET- 04/30/23 Epic Chest x-ray - 03/31/23 Epic EKG - 03/31/23 Epic Stress Test -  ECHO -  Cardiac Cath -  Pacemaker/ICD device last checked: Spinal Cord Stimulator:  Bowel Prep -   Sleep Study -  CPAP -   Fasting Blood Sugar -  Checks Blood Sugar _____ times a day  Last dose of GLP1 agonist-  N/A GLP1 instructions:  N/A   Last dose of SGLT-2 inhibitors-  N/A SGLT-2 instructions: N/A   Blood Thinner Instructions:  Time Aspirin Instructions: Last Dose:  Activity level:  Can go up a flight of stairs and perform activities of daily living without stopping and without symptoms of chest pain or shortness of breath.  Able to exercise without symptoms  Unable to go up a flight of stairs without symptoms of     Anesthesia review: needs clearance? Pulmonary and cards?  Patient denies shortness of breath, fever, cough and chest pain at PAT appointment  Patient verbalized understanding of instructions that were given to them at the PAT appointment. Patient was also instructed that they will need to review over the PAT instructions again at home before surgery.

## 2023-06-04 NOTE — Patient Instructions (Addendum)
SURGICAL WAITING ROOM VISITATION  Patients having surgery or a procedure may have no more than 2 support people in the waiting area - these visitors may rotate.    Children under the age of 2 must have an adult with them who is not the patient.  Due to an increase in RSV and influenza rates and associated hospitalizations, children ages 56 and under may not visit patients in [facility] hospitals.  If the patient needs to stay at the hospital during part of their recovery, the visitor guidelines for inpatient rooms apply. Pre-op nurse will coordinate an appropriate time for 1 support person to accompany patient in pre-op.  This support person may not rotate.    Please refer to the [facility] website for the visitor guidelines for Inpatients (after your surgery is over and you are in a regular room).    Your procedure is scheduled on: 06/15/23   Report to [facility] Main Entrance    Report to admitting at 10:15 AM   Call this number if you have problems the morning of surgery [phone]   Follow a clear liquids diet the day before.   You may have the following liquids until 7:15 AM DAY OF SURGERY  Water Non-Citrus Juices (without pulp, NO RED-Apple, White grape, White cranberry) Black Coffee (NO MILK/CREAM OR CREAMERS, sugar ok)  Clear Tea (NO MILK/CREAM OR CREAMERS, sugar ok) regular and decaf                             Plain Jell-O (NO RED)                                           Fruit ices (not with fruit pulp, NO RED)                                     Popsicles (NO RED)                                                               Sports drinks like Gatorade (NO RED)                     If you have questions, please contact your surgeon's office.   FOLLOW BOWEL PREP AND ANY ADDITIONAL PRE OP INSTRUCTIONS YOU RECEIVED FROM YOUR SURGEON'S OFFICE!!!     Oral Hygiene is also important to reduce your risk of infection.                                     Remember - BRUSH YOUR TEETH THE MORNING OF SURGERY WITH YOUR REGULAR TOOTHPASTE  DENTURES WILL BE REMOVED PRIOR TO SURGERY PLEASE DO NOT APPLY "Poly grip" OR ADHESIVES!!!   Do NOT smoke after Midnight   Stop all vitamins and herbal supplements 7 days before surgery.   Take these medicines the morning of surgery with A SIP OF WATER: Tramadol   DO NOT TAKE ANY ORAL DIABETIC  MEDICATIONS DAY OF YOUR SURGERY  Bring CPAP mask and tubing day of surgery.                              You may not have any metal on your body including jewelry, and body piercing             Do not wear lotions, powders, cologne, or deodorant              Men may shave face and neck.   Do not bring valuables to the hospital. [facility] IS NOT             RESPONSIBLE   FOR VALUABLES.   Contacts, glasses, dentures or bridgework may not be worn into surgery.   Bring small overnight bag day of surgery.   DO NOT BRING YOUR HOME MEDICATIONS TO THE HOSPITAL. PHARMACY WILL DISPENSE MEDICATIONS LISTED ON YOUR MEDICATION LIST TO YOU DURING YOUR ADMISSION IN THE HOSPITAL!    Special Instructions: Bring a copy of your healthcare power of attorney and living will documents the day of surgery if you haven't scanned them before.              Please read over the following fact sheets you were given: IF YOU HAVE QUESTIONS ABOUT YOUR PRE-OP INSTRUCTIONS PLEASE CALL [phone] [place]    If you received a COVID test during your pre-op visit  it is requested that you wear a mask when out in public, stay away from anyone that may not be feeling well and notify your surgeon if you develop symptoms. If you test positive for Covid or have been in contact with anyone that has tested positive in the last 10 days please notify you surgeon.    [place] - Preparing for Surgery Before surgery, you can play an important role.  Because skin is not sterile, your skin needs to be as free of germs as possible.  You can reduce the  number of germs on your skin by washing with CHG (chlorahexidine gluconate) soap before surgery.  CHG is an antiseptic cleaner which kills germs and bonds with the skin to continue killing germs even after washing. Please DO NOT use if you have an allergy to CHG or antibacterial soaps.  If your skin becomes reddened/irritated stop using the CHG and inform your nurse when you arrive at Short Stay. Do not shave (including legs and underarms) for at least 48 hours prior to the first CHG shower.  You may shave your face/neck.  Please follow these instructions carefully:  1.  Shower with CHG Soap the night before surgery and the  morning of surgery.  2.  If you choose to wash your hair, wash your hair first as usual with your normal  shampoo.  3.  After you shampoo, rinse your hair and body thoroughly to remove the shampoo.                             4.  Use CHG as you would any other liquid soap.  You can apply chg directly to the skin and wash.  Gently with a scrungie or clean washcloth.  5.  Apply the CHG Soap to your body ONLY FROM THE NECK DOWN.   Do   not use on face/ open  Wound or open sores. Avoid contact with eyes, ears mouth and   genitals (private parts).                       Wash face,  Genitals (private parts) with your normal soap.             6.  Wash thoroughly, paying special attention to the area where your    surgery  will be performed.  7.  Thoroughly rinse your body with warm water from the neck down.  8.  DO NOT shower/wash with your normal soap after using and rinsing off the CHG Soap.                9.  Pat yourself dry with a clean towel.            10.  Wear clean pajamas.            11.  Place clean sheets on your bed the night of your first shower and do not  sleep with pets. Day of Surgery : Do not apply any lotions/deodorants the morning of surgery.  Please wear clean clothes to the hospital/surgery center.  FAILURE TO FOLLOW THESE  INSTRUCTIONS MAY RESULT IN THE CANCELLATION OF YOUR SURGERY  PATIENT SIGNATURE_________________________________  NURSE SIGNATURE__________________________________  ________________________________________________________________________ WHAT IS A BLOOD TRANSFUSION? Blood Transfusion Information  A transfusion is the replacement of blood or some of its parts. Blood is made up of multiple cells which provide different functions. Red blood cells carry oxygen and are used for blood loss replacement. White blood cells fight against infection. Platelets control bleeding. Plasma helps clot blood. Other blood products are available for specialized needs, such as hemophilia or other clotting disorders. BEFORE THE TRANSFUSION  Who gives blood for transfusions?  Healthy volunteers who are fully evaluated to make sure their blood is safe. This is blood bank blood. Transfusion therapy is the safest it has ever been in the practice of medicine. Before blood is taken from a donor, a complete history is taken to make sure that person has no history of diseases nor engages in risky social behavior (examples are intravenous drug use or sexual activity with multiple partners). The donor's travel history is screened to minimize risk of transmitting infections, such as malaria. The donated blood is tested for signs of infectious diseases, such as HIV and hepatitis. The blood is then tested to be sure it is compatible with you in order to minimize the chance of a transfusion reaction. If you or a relative donates blood, this is often done in anticipation of surgery and is not appropriate for emergency situations. It takes many days to process the donated blood. RISKS AND COMPLICATIONS Although transfusion therapy is very safe and saves many lives, the main dangers of transfusion include:  Getting an infectious disease. Developing a transfusion reaction. This is an allergic reaction to something in the blood you were  given. Every precaution is taken to prevent this. The decision to have a blood transfusion has been considered carefully by your caregiver before blood is given. Blood is not given unless the benefits outweigh the risks. AFTER THE TRANSFUSION Right after receiving a blood transfusion, you will usually feel much better and more energetic. This is especially true if your red blood cells have gotten low (anemic). The transfusion raises the level of the red blood cells which carry oxygen, and this usually causes an energy increase. The nurse administering the transfusion will  monitor you carefully for complications. HOME CARE INSTRUCTIONS  No special instructions are needed after a transfusion. You may find your energy is better. Speak with your caregiver about any limitations on activity for underlying diseases you may have. SEEK MEDICAL CARE IF:  Your condition is not improving after your transfusion. You develop redness or irritation at the intravenous (IV) site. SEEK IMMEDIATE MEDICAL CARE IF:  Any of the following symptoms occur over the next 12 hours: Shaking chills. You have a temperature by mouth above 102 F (38.9 C), not controlled by medicine. Chest, back, or muscle pain. People around you feel you are not acting correctly or are confused. Shortness of breath or difficulty breathing. Dizziness and fainting. You get a rash or develop hives. You have a decrease in urine output. Your urine turns a dark color or changes to pink, red, or brown. Any of the following symptoms occur over the next 10 days: You have a temperature by mouth above 102 F (38.9 C), not controlled by medicine. Shortness of breath. Weakness after normal activity. The white part of the eye turns yellow (jaundice). You have a decrease in the amount of urine or are urinating less often. Your urine turns a dark color or changes to pink, red, or brown. Document Released: 07/21/2000 Document Revised: 10/16/2011  Document Reviewed: 03/09/2008 [facility] Patient Information 2014 [place], [place].  _______________________________________________________________________

## 2023-06-05 ENCOUNTER — Other Ambulatory Visit: Payer: Self-pay

## 2023-06-05 ENCOUNTER — Encounter (HOSPITAL_COMMUNITY): Payer: Self-pay

## 2023-06-05 ENCOUNTER — Encounter (HOSPITAL_COMMUNITY)
Admission: RE | Admit: 2023-06-05 | Discharge: 2023-06-05 | Disposition: A | Discharge status: Home / Self Care | Payer: Medicare HMO | Source: Ambulatory Visit | Attending: Urology | Admitting: Urology

## 2023-06-05 DIAGNOSIS — I10 Essential (primary) hypertension: Secondary | ICD-10-CM | POA: Insufficient documentation

## 2023-06-05 DIAGNOSIS — Z01812 Encounter for preprocedural laboratory examination: Secondary | ICD-10-CM | POA: Insufficient documentation

## 2023-06-05 DIAGNOSIS — F17200 Nicotine dependence, unspecified, uncomplicated: Secondary | ICD-10-CM | POA: Insufficient documentation

## 2023-06-05 DIAGNOSIS — J449 Chronic obstructive pulmonary disease, unspecified: Secondary | ICD-10-CM | POA: Insufficient documentation

## 2023-06-05 DIAGNOSIS — N2889 Other specified disorders of kidney and ureter: Secondary | ICD-10-CM | POA: Insufficient documentation

## 2023-06-05 HISTORY — DX: Essential (primary) hypertension: I10

## 2023-06-05 HISTORY — DX: Other seasonal allergic rhinitis: J30.2

## 2023-06-05 LAB — BASIC METABOLIC PANEL
Anion gap: 7 (ref 5–15)
BUN: 15 mg/dL (ref 8–23)
CO2: 24 mmol/L (ref 22–32)
Calcium: 9.1 mg/dL (ref 8.9–10.3)
Chloride: 105 mmol/L (ref 98–111)
Creatinine, Ser: 0.63 mg/dL (ref 0.61–1.24)
GFR, Estimated: >60 mL/min (ref >60)
Glucose, Bld: 109 mg/dL — ABNORMAL HIGH (ref 70–99)
Potassium: 4.3 mmol/L (ref 3.5–5.1)
Sodium: 136 mmol/L (ref 135–145)

## 2023-06-05 LAB — CBC
HCT: 46.6 % (ref 39.0–52.0)
Hemoglobin: 15.4 g/dL (ref 13.0–17.0)
MCH: 31.6 pg (ref 26.0–34.0)
MCHC: 33 g/dL (ref 30.0–36.0)
MCV: 95.7 fL (ref 80.0–100.0)
Platelets: 280 10*3/uL (ref 150–400)
RBC: 4.87 MIL/uL (ref 4.22–5.81)
RDW: 12.6 % (ref 11.5–15.5)
WBC: 12.2 10*3/uL — ABNORMAL HIGH (ref 4.0–10.5)
nRBC: 0 % (ref 0.0–0.2)

## 2023-06-08 NOTE — Progress Notes (Signed)
Anesthesia Chart Review   Case: [id number] Date/Time: 06/15/23 1215   Procedure: XI ROBOTIC ASSISTED RIGHT LAPAROSCOPIC NEPHRECTOMY (Right)   Anesthesia type: General   Pre-op diagnosis: RIGHT RENAL MASS   Location: [facility] ROOM 05 / [facility]   Surgeons: Rene Paci, MD       DISCUSSION:72 y.o. smoker with h/o HTN, COPD, right renal mass scheduled for above procedure 06/15/2023 with Dr. Cristal Deer Liliane Shi.   Pt seen by cardiology 06/06/2023 for evaluation of RBBB on EKG.  Stress test and Echo ordered due to his limited functional status secondary to COPD and back pain, pt can barely walk 1 or 2 blocks. Tests are pending.  VS: BP (!) 166/81   Pulse 67   Temp 37 C (Oral)   Resp 14   Ht 5\' 10"  (1.778 m)   Wt 84.4 kg   SpO2 92%   BMI 26.69 kg/m   PROVIDERS: Beam, Chales Salmon, MD is PCP    LABS: Labs reviewed: Acceptable for surgery. (all labs ordered are listed, but only abnormal results are displayed)  Labs Reviewed  CBC - Abnormal; Notable for the following components:      Result Value   WBC 12.2 (*)    All other components within normal limits  BASIC METABOLIC PANEL - Abnormal; Notable for the following components:   Glucose, Bld 109 (*)    All other components within normal limits  TYPE AND SCREEN     IMAGES:   EKG:   CV:  Past Medical History:  Diagnosis Date   Arthritis    History of kidney stones    Hypertension    Seasonal allergies     Past Surgical History:  Procedure Laterality Date   ANKLE SURGERY Bilateral    Broken Ankles   EXCISION OF KELOID Left 05/17/2021   Procedure: EXCISION SEBACEOUS CYST POSTERIOR NECK AND BACK, EXCISION SEBACEOUS CYST LEFT CHEST;  Surgeon: Violeta Gelinas, MD;  Location[facility] OR;  Service: General;  Laterality: Left;   EYE SURGERY     SHOULDER SURGERY Right     MEDICATIONS:  ibuprofen (ADVIL) 200 MG tablet   olmesartan (BENICAR) 40 MG tablet   rosuvastatin (CRESTOR) 20 MG tablet   traMADol (ULTRAM) 50 MG  tablet   No current facility-administered medications for this encounter.     Jodell Cipro Ward, PA-C [facility] Pre-Surgical Testing [phone]

## 2023-06-12 ENCOUNTER — Other Ambulatory Visit (HOSPITAL_COMMUNITY): Payer: Self-pay | Admitting: Urology

## 2023-06-12 DIAGNOSIS — R0989 Other specified symptoms and signs involving the circulatory and respiratory systems: Secondary | ICD-10-CM

## 2023-06-13 ENCOUNTER — Ambulatory Visit (HOSPITAL_BASED_OUTPATIENT_CLINIC_OR_DEPARTMENT_OTHER)
Admission: RE | Admit: 2023-06-13 | Discharge: 2023-06-13 | Disposition: A | Discharge status: Home / Self Care | Payer: Medicare HMO | Source: Ambulatory Visit | Attending: Urology | Admitting: Urology

## 2023-06-13 DIAGNOSIS — R0989 Other specified symptoms and signs involving the circulatory and respiratory systems: Secondary | ICD-10-CM | POA: Insufficient documentation

## 2023-06-13 NOTE — Anesthesia Preprocedure Evaluation (Addendum)
Anesthesia Evaluation  Patient identified by MRN, date of birth, ID band Patient awake    Reviewed: Allergy & Precautions, NPO status , Patient's Chart, lab work & pertinent test results  History of Anesthesia Complications Negative for: history of anesthetic complications  Airway Mallampati: III  TM Distance: >3 FB Neck ROM: Full   Comment: Previous grade I view with MAC 4, easy mask Dental  (+) Edentulous Upper, Edentulous Lower, Dental Advisory Given   Pulmonary neg shortness of breath, neg sleep apnea, neg COPD, neg recent URI, Current Smoker and Patient abstained from smoking.   Pulmonary exam normal breath sounds clear to auscultation       Cardiovascular hypertension (olmesartan), Pt. on medications (-) angina (-) Past MI, (-) Cardiac Stents and (-) CABG + dysrhythmias (RBBB) + Valvular Problems/Murmurs (mild) AS  Rhythm:Regular Rate:Normal  HLD  TTE 06/07/2023: Impression  Left Ventricle: Left ventricle size is normal. There is mild concentric hypertrophy. Systolic function is hyperdynamic. EF: 65-70%. LVOT gradeint of 15-20 mm Hg, no systolic anterior motion of mitral valve.   Left Ventricle: Doppler parameters consistent with mild diastolic dysfunction and low to normal LA pressure.   Left Atrium: Left atrium is mildly dilated at 4.100 cm.   Right Ventricle: Right ventricle size is normal. Systolic function is normal.   Aortic Valve: There is mild stenosis, with peak and mean gradients of 26.000 and 13.000 mmHg.   Aortic Valve: Trace aortic valve regurgitation with centrally directed jet.   Mitral Valve: There is posterior annular calcification.  NM Stress Test 06/12/2023: Impression  IMPRESSION: 1.  No evidence of inducible ischemia. 2.  Patient's heart rate increased to approximately 180 bpm during the exam and therefore portions of the exam could not be obtained. Recommend correlation for history of  arrhythmias.     Neuro/Psych neg Seizures negative neurological ROS     GI/Hepatic negative GI ROS, Neg liver ROS,,,  Endo/Other    Renal/GU Renal disease (right renal mass)     Musculoskeletal  (+) Arthritis ,    Abdominal   Peds  Hematology negative hematology ROS (+) Lab Results      Component                Value               Date                      WBC                      12.2 (H)            06/05/2023                HGB                      15.4                06/05/2023                HCT                      46.6                06/05/2023                MCV  95.7                06/05/2023                PLT                      280                 06/05/2023              Anesthesia Other Findings   Reproductive/Obstetrics                             Anesthesia Physical Anesthesia Plan  ASA: 3  Anesthesia Plan: General   Post-op Pain Management: Tylenol PO (pre-op)*   Induction: Intravenous  PONV Risk Score and Plan: 1 and Ondansetron, Dexamethasone and Treatment may vary due to age or medical condition  Airway Management Planned: Oral ETT  Additional Equipment:   Intra-op Plan:   Post-operative Plan: Extubation in OR  Informed Consent: I have reviewed the patients History and Physical, chart, labs and discussed the procedure including the risks, benefits and alternatives for the proposed anesthesia with the patient or authorized representative who has indicated his/her understanding and acceptance.     Dental advisory given  Plan Discussed with: CRNA and Anesthesiologist  Anesthesia Plan Comments: (See PAT note 06/05/2023  Risks of general anesthesia discussed including, but not limited to, sore throat, hoarse voice, chipped/damaged teeth, injury to vocal cords, nausea and vomiting, allergic reactions, lung infection, heart attack, stroke, and death. All questions answered. )        Anesthesia Quick Evaluation
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
## 2023-06-15 ENCOUNTER — Other Ambulatory Visit: Payer: Self-pay

## 2023-06-15 ENCOUNTER — Encounter (HOSPITAL_COMMUNITY)
Admission: RE | Disposition: A | Discharge status: Home / Self Care | Payer: Self-pay | Source: Home / Self Care | Attending: Urology

## 2023-06-15 ENCOUNTER — Encounter (HOSPITAL_COMMUNITY): Payer: Self-pay | Admitting: Urology

## 2023-06-15 ENCOUNTER — Inpatient Hospital Stay (HOSPITAL_COMMUNITY): Payer: Medicare HMO | Admitting: Anesthesiology

## 2023-06-15 ENCOUNTER — Inpatient Hospital Stay (HOSPITAL_COMMUNITY)
Admission: RE | Admit: 2023-06-15 | Discharge: 2023-06-16 | DRG: 658 | Disposition: A | Discharge status: Home / Self Care | Payer: Medicare HMO | Attending: Urology | Admitting: Urology

## 2023-06-15 ENCOUNTER — Inpatient Hospital Stay (HOSPITAL_COMMUNITY): Payer: Medicare HMO | Admitting: Physician Assistant

## 2023-06-15 DIAGNOSIS — N2889 Other specified disorders of kidney and ureter: Secondary | ICD-10-CM | POA: Diagnosis present

## 2023-06-15 DIAGNOSIS — Z716 Tobacco abuse counseling: Secondary | ICD-10-CM | POA: Diagnosis not present

## 2023-06-15 DIAGNOSIS — I10 Essential (primary) hypertension: Secondary | ICD-10-CM | POA: Diagnosis present

## 2023-06-15 DIAGNOSIS — Z79899 Other long term (current) drug therapy: Secondary | ICD-10-CM | POA: Diagnosis not present

## 2023-06-15 DIAGNOSIS — F17200 Nicotine dependence, unspecified, uncomplicated: Secondary | ICD-10-CM | POA: Diagnosis present

## 2023-06-15 DIAGNOSIS — C641 Malignant neoplasm of right kidney, except renal pelvis: Principal | ICD-10-CM | POA: Diagnosis present

## 2023-06-15 HISTORY — PX: ROBOT ASSISTED LAPAROSCOPIC NEPHRECTOMY: SHX5140

## 2023-06-15 LAB — TYPE AND SCREEN
ABO/RH(D): O POS
Antibody Screen: NEGATIVE

## 2023-06-15 LAB — BASIC METABOLIC PANEL
Anion gap: 8 (ref 5–15)
BUN: 14 mg/dL (ref 8–23)
CO2: 27 mmol/L (ref 22–32)
Calcium: 8.9 mg/dL (ref 8.9–10.3)
Chloride: 100 mmol/L (ref 98–111)
Creatinine, Ser: 1.1 mg/dL (ref 0.61–1.24)
GFR, Estimated: >60 mL/min (ref >60)
Glucose, Bld: 147 mg/dL — ABNORMAL HIGH (ref 70–99)
Potassium: 4.6 mmol/L (ref 3.5–5.1)
Sodium: 135 mmol/L (ref 135–145)

## 2023-06-15 LAB — ABO/RH: ABO/RH(D): O POS

## 2023-06-15 LAB — HEMOGLOBIN AND HEMATOCRIT, BLOOD
HCT: 46.8 % (ref 39.0–52.0)
Hemoglobin: 15.1 g/dL (ref 13.0–17.0)

## 2023-06-15 SURGERY — NEPHRECTOMY, RADICAL, ROBOT-ASSISTED, LAPAROSCOPIC, ADULT
Anesthesia: General | Laterality: Right

## 2023-06-15 MED ORDER — SUGAMMADEX SODIUM 200 MG/2ML IV SOLN
INTRAVENOUS | Status: DC | PRN
  Administered 2023-06-15: 200 mg via INTRAVENOUS

## 2023-06-15 MED ORDER — KETOROLAC TROMETHAMINE 30 MG/ML IJ SOLN
INTRAMUSCULAR | Status: AC
  Filled 2023-06-15: qty 1

## 2023-06-15 MED ORDER — FENTANYL CITRATE (PF) 100 MCG/2ML IJ SOLN
INTRAMUSCULAR | Status: AC
  Filled 2023-06-15: qty 2

## 2023-06-15 MED ORDER — STERILE WATER FOR IRRIGATION IR SOLN
Status: DC | PRN
  Administered 2023-06-15: 500 mL

## 2023-06-15 MED ORDER — ROCURONIUM BROMIDE 100 MG/10ML IV SOLN
INTRAVENOUS | Status: DC | PRN
  Administered 2023-06-15: 50 mg via INTRAVENOUS
  Administered 2023-06-15: 20 mg via INTRAVENOUS

## 2023-06-15 MED ORDER — AMISULPRIDE (ANTIEMETIC) 5 MG/2ML IV SOLN: 10.0000 mg | Freq: Once | INTRAVENOUS | Status: DC | PRN

## 2023-06-15 MED ORDER — FENTANYL CITRATE (PF) 250 MCG/5ML IJ SOLN
INTRAMUSCULAR | Status: AC
  Filled 2023-06-15: qty 5

## 2023-06-15 MED ORDER — ESMOLOL HCL 100 MG/10ML IV SOLN
INTRAVENOUS | Status: AC
  Filled 2023-06-15: qty 10

## 2023-06-15 MED ORDER — OXYCODONE HCL 5 MG/5ML PO SOLN
5.0000 mg | Freq: Once | ORAL | Status: AC | PRN
Start: 2023-06-15 — End: 2023-06-15

## 2023-06-15 MED ORDER — NICOTINE 21 MG/24HR TD PT24
21.0000 mg | MEDICATED_PATCH | Freq: Every day | TRANSDERMAL | Status: DC
  Administered 2023-06-15: 21 mg via TRANSDERMAL
  Filled 2023-06-15 (×2): qty 1

## 2023-06-15 MED ORDER — OXYCODONE HCL 5 MG PO TABS
ORAL_TABLET | ORAL | Status: AC
  Filled 2023-06-15: qty 1

## 2023-06-15 MED ORDER — DEXAMETHASONE SODIUM PHOSPHATE 10 MG/ML IJ SOLN
INTRAMUSCULAR | Status: AC
  Filled 2023-06-15: qty 1

## 2023-06-15 MED ORDER — CEFAZOLIN SODIUM-DEXTROSE 2-4 GM/100ML-% IV SOLN
2.0000 g | INTRAVENOUS | Status: AC
  Administered 2023-06-15: 2 g via INTRAVENOUS
  Filled 2023-06-15: qty 100

## 2023-06-15 MED ORDER — GLYCOPYRROLATE 0.2 MG/ML IJ SOLN
INTRAMUSCULAR | Status: DC | PRN
  Administered 2023-06-15: .2 mg via INTRAVENOUS

## 2023-06-15 MED ORDER — MIDAZOLAM HCL 5 MG/5ML IJ SOLN
INTRAMUSCULAR | Status: DC | PRN
  Administered 2023-06-15: 2 mg via INTRAVENOUS

## 2023-06-15 MED ORDER — LABETALOL HCL 5 MG/ML IV SOLN
INTRAVENOUS | Status: AC
  Filled 2023-06-15: qty 4

## 2023-06-15 MED ORDER — LACTATED RINGERS IV SOLN: INTRAVENOUS | Status: DC

## 2023-06-15 MED ORDER — MIDAZOLAM HCL 2 MG/2ML IJ SOLN
INTRAMUSCULAR | Status: AC
  Filled 2023-06-15: qty 2

## 2023-06-15 MED ORDER — ONDANSETRON HCL 4 MG/2ML IJ SOLN
INTRAMUSCULAR | Status: AC
  Filled 2023-06-15: qty 2

## 2023-06-15 MED ORDER — GLYCOPYRROLATE 0.2 MG/ML IJ SOLN
INTRAMUSCULAR | Status: AC
  Filled 2023-06-15: qty 1

## 2023-06-15 MED ORDER — KCL IN DEXTROSE-NACL 20-5-0.45 MEQ/L-%-% IV SOLN
INTRAVENOUS | Status: AC
  Filled 2023-06-15: qty 1000

## 2023-06-15 MED ORDER — ONDANSETRON HCL 4 MG/2ML IJ SOLN
4.0000 mg | INTRAMUSCULAR | Status: DC | PRN
Start: 2023-06-15 — End: 2023-06-16

## 2023-06-15 MED ORDER — LACTATED RINGERS IV SOLN: INTRAVENOUS | Status: DC | PRN

## 2023-06-15 MED ORDER — DEXAMETHASONE SODIUM PHOSPHATE 4 MG/ML IJ SOLN
INTRAMUSCULAR | Status: DC | PRN
  Administered 2023-06-15: 5 mg via INTRAVENOUS

## 2023-06-15 MED ORDER — BUPIVACAINE LIPOSOME 1.3 % IJ SUSP
INTRAMUSCULAR | Status: DC | PRN
  Administered 2023-06-15: 20 mL

## 2023-06-15 MED ORDER — SODIUM CHLORIDE (PF) 0.9 % IJ SOLN
INTRAMUSCULAR | Status: AC
  Filled 2023-06-15: qty 20

## 2023-06-15 MED ORDER — HYDROMORPHONE HCL 1 MG/ML IJ SOLN
0.5000 mg | INTRAMUSCULAR | Status: DC | PRN
Start: 2023-06-15 — End: 2023-06-16
  Administered 2023-06-15 – 2023-06-16 (×3): 1 mg via INTRAVENOUS
  Filled 2023-06-15 (×4): qty 1

## 2023-06-15 MED ORDER — ACETAMINOPHEN 500 MG PO TABS
1000.0000 mg | ORAL_TABLET | Freq: Once | ORAL | Status: AC
  Administered 2023-06-15: 1000 mg via ORAL
  Filled 2023-06-15: qty 2

## 2023-06-15 MED ORDER — PROPOFOL 10 MG/ML IV BOLUS
INTRAVENOUS | Status: DC | PRN
  Administered 2023-06-15: 150 mg via INTRAVENOUS

## 2023-06-15 MED ORDER — ORAL CARE MOUTH RINSE: 15.0000 mL | Freq: Once | OROMUCOSAL | Status: AC

## 2023-06-15 MED ORDER — ACETAMINOPHEN 500 MG PO TABS
1000.0000 mg | ORAL_TABLET | Freq: Four times a day (QID) | ORAL | Status: DC
  Administered 2023-06-16: 1000 mg via ORAL
  Filled 2023-06-15: qty 2

## 2023-06-15 MED ORDER — DEXMEDETOMIDINE HCL IN NACL 80 MCG/20ML IV SOLN
INTRAVENOUS | Status: DC | PRN
  Administered 2023-06-15: 8 ug via INTRAVENOUS

## 2023-06-15 MED ORDER — SODIUM CHLORIDE (PF) 0.9 % IJ SOLN
INTRAMUSCULAR | Status: DC | PRN
  Administered 2023-06-15: 20 mL

## 2023-06-15 MED ORDER — TRAMADOL HCL 50 MG PO TABS
50.0000 mg | ORAL_TABLET | ORAL | Status: DC | PRN
  Administered 2023-06-16: 50 mg via ORAL
  Filled 2023-06-15: qty 1

## 2023-06-15 MED ORDER — PHENYLEPHRINE HCL (PRESSORS) 10 MG/ML IV SOLN
INTRAVENOUS | Status: AC
  Filled 2023-06-15: qty 1

## 2023-06-15 MED ORDER — LIDOCAINE HCL (CARDIAC) PF 100 MG/5ML IV SOSY
PREFILLED_SYRINGE | INTRAVENOUS | Status: DC | PRN
  Administered 2023-06-15: 80 mg via INTRAVENOUS

## 2023-06-15 MED ORDER — OXYCODONE HCL 5 MG PO TABS
5.0000 mg | ORAL_TABLET | Freq: Once | ORAL | Status: AC | PRN
  Administered 2023-06-15: 5 mg via ORAL

## 2023-06-15 MED ORDER — ONDANSETRON HCL 4 MG/2ML IJ SOLN
INTRAMUSCULAR | Status: DC | PRN
  Administered 2023-06-15: 4 mg via INTRAVENOUS

## 2023-06-15 MED ORDER — PROPOFOL 10 MG/ML IV BOLUS
INTRAVENOUS | Status: AC
  Filled 2023-06-15: qty 20

## 2023-06-15 MED ORDER — CHLORHEXIDINE GLUCONATE 0.12 % MT SOLN
15.0000 mL | Freq: Once | OROMUCOSAL | Status: AC
  Administered 2023-06-15: 15 mL via OROMUCOSAL

## 2023-06-15 MED ORDER — FENTANYL CITRATE PF 50 MCG/ML IJ SOSY
25.0000 ug | PREFILLED_SYRINGE | INTRAMUSCULAR | Status: DC | PRN
Start: 2023-06-15 — End: 2023-06-15
  Administered 2023-06-15 (×3): 50 ug via INTRAVENOUS

## 2023-06-15 MED ORDER — FENTANYL CITRATE (PF) 100 MCG/2ML IJ SOLN
INTRAMUSCULAR | Status: DC | PRN
  Administered 2023-06-15: 50 ug via INTRAVENOUS
  Administered 2023-06-15: 100 ug via INTRAVENOUS
  Administered 2023-06-15 (×4): 50 ug via INTRAVENOUS

## 2023-06-15 MED ORDER — DOCUSATE SODIUM 100 MG PO CAPS
100.0000 mg | ORAL_CAPSULE | Freq: Two times a day (BID) | ORAL | Status: DC
  Administered 2023-06-15 – 2023-06-16 (×2): 100 mg via ORAL
  Filled 2023-06-15 (×2): qty 1

## 2023-06-15 MED ORDER — FENTANYL CITRATE PF 50 MCG/ML IJ SOSY
PREFILLED_SYRINGE | INTRAMUSCULAR | Status: AC
  Filled 2023-06-15: qty 1

## 2023-06-15 MED ORDER — LABETALOL HCL 5 MG/ML IV SOLN
INTRAVENOUS | Status: DC | PRN
  Administered 2023-06-15: 5 mg via INTRAVENOUS
  Administered 2023-06-15 (×2): 2.5 mg via INTRAVENOUS

## 2023-06-15 MED ORDER — FENTANYL CITRATE PF 50 MCG/ML IJ SOSY
PREFILLED_SYRINGE | INTRAMUSCULAR | Status: AC
  Filled 2023-06-15: qty 2

## 2023-06-15 MED ORDER — BUPIVACAINE LIPOSOME 1.3 % IJ SUSP
INTRAMUSCULAR | Status: AC
  Filled 2023-06-15: qty 20

## 2023-06-15 SURGICAL SUPPLY — 65 items
ADH SKN CLS APL DERMABOND .7 (GAUZE/BANDAGES/DRESSINGS) ×1
APL PRP STRL LF DISP 70% ISPRP (MISCELLANEOUS) ×1
BAG COUNTER SPONGE SURGICOUNT (BAG) IMPLANT
BAG LAPAROSCOPIC 12 15 PORT 16 (BASKET) ×1 IMPLANT
BAG RETRIEVAL 12/15 (BASKET) ×1
BAG SPNG CNTER NS LX DISP (BAG)
CAUTERY HOOK MNPLR 1.6 DVNC XI (INSTRUMENTS) ×1 IMPLANT
CHLORAPREP W/TINT 26 (MISCELLANEOUS) ×1 IMPLANT
CLIP LIGATING HEM O LOK PURPLE (MISCELLANEOUS) ×1 IMPLANT
CLIP LIGATING HEMO LOK XL GOLD (MISCELLANEOUS) ×1 IMPLANT
CLIP LIGATING HEMO O LOK GREEN (MISCELLANEOUS) IMPLANT
COVER SURGICAL LIGHT HANDLE (MISCELLANEOUS) ×1 IMPLANT
COVER TIP SHEARS 8 DVNC (MISCELLANEOUS) ×1 IMPLANT
CUTTER ECHEON FLEX ENDO 45 340 (ENDOMECHANICALS) IMPLANT
DERMABOND ADVANCED .7 DNX12 (GAUZE/BANDAGES/DRESSINGS) ×1 IMPLANT
DRAPE ARM DVNC X/XI (DISPOSABLE) ×4 IMPLANT
DRAPE COLUMN DVNC XI (DISPOSABLE) ×1 IMPLANT
DRAPE INCISE IOBAN 66X45 STRL (DRAPES) ×1 IMPLANT
DRAPE SHEET LG 3/4 BI-LAMINATE (DRAPES) ×1 IMPLANT
DRIVER NDL LRG 8 DVNC XI (INSTRUMENTS) ×2 IMPLANT
DRIVER NDLE LRG 8 DVNC XI (INSTRUMENTS) ×2
ELECT PENCIL ROCKER SW 15FT (MISCELLANEOUS) ×1 IMPLANT
ELECT REM PT RETURN 15FT ADLT (MISCELLANEOUS) ×1 IMPLANT
FORCEPS PROGRASP DVNC XI (FORCEP) ×2 IMPLANT
GAUZE 4X4 16PLY ~~LOC~~+RFID DBL (SPONGE) IMPLANT
GLOVE BIO SURGEON STRL SZ 6.5 (GLOVE) ×1 IMPLANT
GLOVE BIOGEL PI IND STRL 8 (GLOVE) ×1 IMPLANT
GLOVE SURG LX STRL 7.5 STRW (GLOVE) ×2 IMPLANT
GOWN SRG XL LVL 4 BRTHBL STRL (GOWNS) IMPLANT
GOWN STRL NON-REIN XL LVL4 (GOWNS) ×2
GOWN STRL REUS W/ TWL XL LVL3 (GOWN DISPOSABLE) ×2 IMPLANT
GOWN STRL REUS W/TWL XL LVL3 (GOWN DISPOSABLE) ×2
GOWN STRL SURGICAL XL XLNG (GOWN DISPOSABLE) ×1 IMPLANT
HOLDER FOLEY CATH W/STRAP (MISCELLANEOUS) ×1 IMPLANT
IRRIG SUCT STRYKERFLOW 2 WTIP (MISCELLANEOUS) ×1
IRRIGATION SUCT STRKRFLW 2 WTP (MISCELLANEOUS) ×1 IMPLANT
KIT BASIN OR (CUSTOM PROCEDURE TRAY) ×1 IMPLANT
KIT TURNOVER KIT A (KITS) IMPLANT
MARKER SKIN DUAL TIP RULER LAB (MISCELLANEOUS) ×1 IMPLANT
NDL INSUFFLATION 14GA 120MM (NEEDLE) ×1 IMPLANT
NEEDLE INSUFFLATION 14GA 120MM (NEEDLE) ×2
PROTECTOR NERVE ULNAR (MISCELLANEOUS) ×2 IMPLANT
RELOAD STAPLE 45 2.6 WHT THIN (STAPLE) IMPLANT
SCISSORS LAP 5X45 EPIX DISP (ENDOMECHANICALS) ×1 IMPLANT
SCISSORS MNPLR CVD DVNC XI (INSTRUMENTS) ×1 IMPLANT
SEAL UNIV 5-12 XI (MISCELLANEOUS) ×4 IMPLANT
SET TUBE SMOKE EVAC HIGH FLOW (TUBING) ×1 IMPLANT
SOL ELECTROSURG ANTI STICK (MISCELLANEOUS) ×1
SOLUTION ELECTROSURG ANTI STCK (MISCELLANEOUS) ×1 IMPLANT
SPIKE FLUID TRANSFER (MISCELLANEOUS) ×1 IMPLANT
STAPLE RELOAD 45 WHT (STAPLE) ×3
STAPLER POWER ECHELON 45 WIDE (STAPLE) IMPLANT
SUT MNCRL AB 4-0 PS2 18 (SUTURE) ×2 IMPLANT
SUT PDS AB 0 CT1 36 (SUTURE) ×2 IMPLANT
SUT VIC AB 0 CT1 27 (SUTURE) ×1
SUT VIC AB 0 CT1 27XBRD ANTBC (SUTURE) ×1 IMPLANT
SUT VIC AB 2-0 SH 27 (SUTURE) ×1
SUT VIC AB 2-0 SH 27X BRD (SUTURE) ×1 IMPLANT
SUT VICRYL 0 UR6 27IN ABS (SUTURE) IMPLANT
TOWEL OR 17X26 10 PK STRL BLUE (TOWEL DISPOSABLE) ×1 IMPLANT
TRAY FOLEY MTR SLVR 16FR STAT (SET/KITS/TRAYS/PACK) ×1 IMPLANT
TRAY LAPAROSCOPIC (CUSTOM PROCEDURE TRAY) ×1 IMPLANT
TROCAR Z THREAD OPTICAL 12X100 (TROCAR) ×1 IMPLANT
TROCAR Z-THREAD OPTICAL 5X100M (TROCAR) IMPLANT
WATER STERILE IRR 1000ML POUR (IV SOLUTION) ×1 IMPLANT

## 2023-06-15 NOTE — Transfer of Care (Signed)
Immediate Anesthesia Transfer of Care Note  Patient: Edward Lawson  Procedure(s) Performed: XI ROBOTIC ASSISTED RIGHT LAPAROSCOPIC NEPHRECTOMY (Right)  Patient Location: PACU  Anesthesia Type:General  Level of Consciousness: awake and alert   Airway & Oxygen Therapy: Patient Spontanous Breathing and Patient connected to nasal cannula oxygen  Post-op Assessment: Report given to RN and Post -op Vital signs reviewed and stable  Post vital signs: Reviewed and stable  Last Vitals:  Vitals Value Taken Time  BP 159/75 06/15/23 1500  Temp    Pulse 54 06/15/23 1502  Resp 13 06/15/23 1502  SpO2 97 % 06/15/23 1502  Vitals shown include unfiled device data.  Last Pain:  Vitals:   06/15/23 1101  TempSrc:   PainSc: 0-No pain         Complications: No notable events documented.

## 2023-06-15 NOTE — Op Note (Signed)
Operative Note  Preoperative diagnosis:  1.  5.9 cm right renal mass  Postoperative diagnosis: 1.  5.9 cm right renal mass  Procedure(s): 1.  Robot-assisted laparoscopic right radical nephrectomy (adrenal sparing)  Surgeon: Rhoderick Moody, MD  Assistants: Georgiana Spinner, MD PGY 4  Anesthesia:  General  Complications:  None  EBL: 50 mL  Specimens: 1.  Right kidney  Drains/Catheters: 1.  Foley catheter  Intraoperative findings:   Right renal hilum was hemostatic following staple ligation  Indication:  Edward Lawson is a 72 y.o. 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  He has been consented for the above procedures, voices understanding and wishes to proceed.  Description of procedure:  After informed consent was signed, the patient was taken back to the operating room and properly anesthetized.  The patient was then placed in the left lateral decubitus position with all pressure points padded.  The abdomen was then prepped and draped in the usual sterile fashion.  A time-out was then performed.     An 8 mm incision was then made lateral to the right rectus muscle at the level of the right 12th rib.  A Veress needle was then used to access the abdominal cavity.  A saline drop test showed no signs of obstruction and aspiration of the Veress needle revealed no blood or sucus.  The abdominal cavity was then insufflated to 15 mmHg.  An 8 mm robotic trocar was then atraumatically inserted into the abdominal cavity.  The robotic camera was then inserted through the port and inspection of the abdominal cavity revealed no evidence of adjacent organ or vessel injury. We then placed three additional 8 mm robotic ports, a 12 mm assistant port and a 5 mm subxiphoid port in such as fashion as to triangulate the right renal hilum.  The robot was then docked into postion.   Using a combination of blunt and cold scissors  dissection, the hepatic attachments were released from the abdominal sidewall.  A locking grasper was then inserted through the 5 mm sub-xyphoid port and used to retract the posterior surface of the liver more cephalad.  The white line of Toldt along the ascending colon was then incised, allowing [place] to reflect the colon medially and expose the anterior surface of the right kidney.  The duodenum was then Kocherized medially, which abruptly led [place] to the identification of the inferior vena cava.    Once the colon was adequately mobilized, we moved to the lower pole and identified the gonadal vein and ureter.  The gonadal vein was then left running parallel to the vena cava and the right ureter was reflected anteriorly.  Using cautious cautery, the overlying perihilar attachments were then released.  This yielded visualization of the renal hilum, which included a single right renal vein and a single right renal artery.  The perilymphatic tissue surrounding the right renal artery were carefully released so that the right renal artery was fully encircled.    A 45 mm powered endovascular stapler was then used to ligate the right renal artery and vein, en bloc.  The right hilar stump was hemostatic following staple ligation.  Hemoclips were then applied to the proximal aspects of the right ureter, which was then sharply incised.  The remaining perinephric attachments were then incised using electrocautery. Reinspection of the right retroperitoneal space revealed excellent hemostasis. Once the right kidney was fully mobile, it was placed in an Endo  Catch bag and left in the abdominal cavity.   The 12 mm midline assistant port was then closed using the [employer] technique with a 0 Vicryl suture.  A right lower quadrant Gibson incision was then made in the right kidney was removed within the Endo Catch bag.  The fascia of the external and internal oblique were then closed with a running 0 PDS suture.  The skin  incisions were then closed using 4-0 Monocryl.  Dermabond was applied to all skin incisions.   Plan: Monitor on the floor

## 2023-06-15 NOTE — Discharge Instructions (Signed)
Activity:  You are encouraged to ambulate frequently (about every hour during waking hours) to help prevent blood clots from forming in your legs or lungs.  However, you should not engage in any heavy lifting (> 10-15 lbs), strenuous activity, or straining. Diet: You should advance your diet as instructed by your physician.  It will be normal to have some bloating, nausea, and abdominal discomfort intermittently. Prescriptions:  You will be provided a prescription for pain medication to take as needed.  If your pain is not severe enough to require the prescription pain medication, you may take extra strength Tylenol instead which will have less side effects.  You should also take a prescribed stool softener to avoid straining with bowel movements as the prescription pain medication may constipate you. Incisions: You may remove your dressing bandages 48 hours after surgery if not removed in the hospital.  You will either have some small staples or special tissue glue at each of the incision sites. Once the bandages are removed (if present), the incisions may stay open to air.  You may start showering (but not soaking or bathing in water) the 2nd day after surgery and the incisions simply need to be patted dry after the shower.  No additional care is needed. What to call us about: You should call the office ([phone]) if you develop fever > 101 or develop persistent vomiting.   You may resume aspirin, advil, aleve, vitamins, and supplements 7 days after surgery. 

## 2023-06-15 NOTE — Plan of Care (Signed)
Patient transferred from PACU, remains on 1-2 L [place], oriented, endorses no pain, foley in place. Strict Io's.   Problem: Education: Goal: Knowledge of the procedure and recovery process will improve Outcome: Progressing   Problem: Bowel/Gastric: Goal: Gastrointestinal status for postoperative course will improve Outcome: Progressing   Problem: Pain Management: Goal: General experience of comfort will improve Outcome: Progressing   Problem: Skin Integrity: Goal: Demonstration of wound healing without infection will improve Outcome: Progressing   Problem: Urinary Elimination: Goal: Ability to avoid or minimize complications of infection will improve Outcome: Progressing Goal: Ability to achieve and maintain urine output will improve Outcome: Progressing Goal: Home care management will improve Outcome: Progressing   Problem: Education: Goal: Knowledge of General Education information will improve Description: Including pain rating scale, medication(s)/side effects and non-pharmacologic comfort measures Outcome: Progressing   Problem: Health Behavior/Discharge Planning: Goal: Ability to manage health-related needs will improve Outcome: Progressing   Problem: Clinical Measurements: Goal: Ability to maintain clinical measurements within normal limits will improve Outcome: Progressing Goal: Will remain free from infection Outcome: Progressing Goal: Diagnostic test results will improve Outcome: Progressing Goal: Respiratory complications will improve Outcome: Progressing Goal: Cardiovascular complication will be avoided Outcome: Progressing   Problem: Activity: Goal: Risk for activity intolerance will decrease Outcome: Progressing   Problem: Nutrition: Goal: Adequate nutrition will be maintained Outcome: Progressing   Problem: Coping: Goal: Level of anxiety will decrease Outcome: Progressing   Problem: Elimination: Goal: Will not experience complications related to  bowel motility Outcome: Progressing Goal: Will not experience complications related to urinary retention Outcome: Progressing   Problem: Pain Management: Goal: General experience of comfort will improve Outcome: Progressing   Problem: Safety: Goal: Ability to remain free from injury will improve Outcome: Progressing   Problem: Skin Integrity: Goal: Risk for impaired skin integrity will decrease Outcome: Progressing

## 2023-06-15 NOTE — Anesthesia Procedure Notes (Signed)
Procedure Name: Intubation Date/Time: 06/15/2023 12:43 PM  Performed by: Deri Fuelling, CRNAPre-anesthesia Checklist: Patient identified, Emergency Drugs available, Suction available and Patient being monitored Patient Re-evaluated:Patient Re-evaluated prior to induction Oxygen Delivery Method: Circle system utilized Preoxygenation: Pre-oxygenation with 100% oxygen Induction Type: IV induction Ventilation: Mask ventilation without difficulty Laryngoscope Size: Mac and 4 Grade View: Grade II Tube type: Oral Tube size: 7.5 mm Number of attempts: 1 Airway Equipment and Method: Stylet and Oral airway Placement Confirmation: ETT inserted through vocal cords under direct vision, positive ETCO2 and breath sounds checked- equal and bilateral Secured at: 22 cm Tube secured with: Tape Dental Injury: Teeth and Oropharynx as per pre-operative assessment

## 2023-06-15 NOTE — Anesthesia Postprocedure Evaluation (Signed)
Anesthesia Post Note  Patient: Edward Lawson  Procedure(s) Performed: XI ROBOTIC ASSISTED RIGHT LAPAROSCOPIC NEPHRECTOMY (Right)     Patient location during evaluation: PACU Anesthesia Type: General Level of consciousness: awake Pain management: pain level controlled Vital Signs Assessment: post-procedure vital signs reviewed and stable Respiratory status: spontaneous breathing, nonlabored ventilation and respiratory function stable Cardiovascular status: blood pressure returned to baseline and stable Postop Assessment: no apparent nausea or vomiting Anesthetic complications: no   No notable events documented.  Last Vitals:  Vitals:   06/15/23 1715 06/15/23 1856  BP: (!) 150/65 132/65  Pulse: (!) 57 60  Resp: 16 14  Temp:    SpO2: 98% 95%    Last Pain:  Vitals:   06/15/23 1900  TempSrc:   PainSc: 4                  Linton Rump

## 2023-06-16 ENCOUNTER — Encounter (HOSPITAL_COMMUNITY): Payer: Self-pay | Admitting: Urology

## 2023-06-16 LAB — BASIC METABOLIC PANEL
Anion gap: 8 (ref 5–15)
BUN: 13 mg/dL (ref 8–23)
CO2: 25 mmol/L (ref 22–32)
Calcium: 8.6 mg/dL — ABNORMAL LOW (ref 8.9–10.3)
Chloride: 100 mmol/L (ref 98–111)
Creatinine, Ser: 1.09 mg/dL (ref 0.61–1.24)
GFR, Estimated: >60 mL/min (ref >60)
Glucose, Bld: 139 mg/dL — ABNORMAL HIGH (ref 70–99)
Potassium: 4.6 mmol/L (ref 3.5–5.1)
Sodium: 133 mmol/L — ABNORMAL LOW (ref 135–145)

## 2023-06-16 LAB — HEMOGLOBIN AND HEMATOCRIT, BLOOD
HCT: 44.5 % (ref 39.0–52.0)
Hemoglobin: 14.5 g/dL (ref 13.0–17.0)

## 2023-06-16 MED ORDER — ORAL CARE MOUTH RINSE: 15.0000 mL | OROMUCOSAL | Status: DC | PRN

## 2023-06-16 NOTE — Plan of Care (Signed)
  Problem: Safety: Goal: Ability to remain free from injury will improve Outcome: Progressing   Problem: Skin Integrity: Goal: Risk for impaired skin integrity will decrease Outcome: Progressing   Problem: Pain Management: Goal: General experience of comfort will improve Outcome: Progressing   Problem: Coping: Goal: Level of anxiety will decrease Outcome: Progressing   Problem: Education: Goal: Knowledge of the procedure and recovery process will improve Outcome: Progressing   Problem: Clinical Measurements: Goal: Ability to maintain clinical measurements within normal limits will improve Outcome: Progressing

## 2023-06-16 NOTE — Progress Notes (Signed)
1 Day Post-Op Subjective: Patient reports good pain control. Negative flatus. Tolerating clears. Hgb 14.5.  Objective: Vital signs in last 24 hours: Temp:  [97.3 F (36.3 C)-98.6 F (37 C)] 98.3 F (36.8 C) (11/09 0831) Pulse Rate:  [53-68] 66 (11/09 0831) Resp:  [9-20] 14 (11/09 0831) BP: (121-175)/(64-91) 146/79 (11/09 0831) SpO2:  [90 %-98 %] 91 % (11/09 0831)  Intake/Output from previous day: 11/08 0701 - 11/09 0700 In: 2559.8 [P.O.:360; I.V.:2099.8; IV Piggyback:100] Out: 1665 [Urine:1625; Blood:40] Intake/Output this shift: No intake/output data recorded.  Physical Exam:  General:alert, cooperative, and appears stated age GI: soft, non tender, normal bowel sounds, no palpable masses, no organomegaly, no inguinal hernia Male genitalia: not done Extremities: extremities normal, atraumatic, no cyanosis or edema  Lab Results: Recent Labs    06/15/23 1616 06/16/23 0452  HGB 15.1 14.5  HCT 46.8 44.5   BMET Recent Labs    06/15/23 1616 06/16/23 0452  NA 135 133*  K 4.6 4.6  CL 100 100  CO2 27 25  GLUCOSE 147* 139*  BUN 14 13  CREATININE 1.10 1.09  CALCIUM 8.9 8.6*   No results for input(s): "LABPT", "INR" in the last 72 hours. No results for input(s): "LABURIN" in the last 72 hours. No results found for this or any previous visit.  Studies/Results: No results found.  Assessment/Plan: POD#1 partial nephrectomy Regular diet Ambulate in halls with assistance  Continue current pain control regiment   LOS: 1 day   Wilkie Aye 06/16/2023, 11:40 AM

## 2023-06-18 LAB — SURGICAL PATHOLOGY

## 2023-06-19 NOTE — Discharge Summary (Signed)
Physician Discharge Summary  Patient ID: Edward Lawson MRN: [id number] DOB/AGE: 10-07-1950 72 y.o.  Admit date: 06/15/2023 Discharge date: 06/16/2023  Admission Diagnoses:  Renal mass, right  Discharge Diagnoses:  Principal Problem:   Renal mass, right   Past Medical History:  Diagnosis Date   Arthritis    History of kidney stones    Hypertension    Seasonal allergies     Surgeries: Procedure(s): XI ROBOTIC ASSISTED RIGHT LAPAROSCOPIC NEPHRECTOMY on 06/15/2023   Consultants (if any): Treatment Team:  Rene Paci, MD  Discharged Condition: Improved  Hospital Course: Edward Lawson is an 72 y.o. male who was admitted 06/15/2023 with a diagnosis of Renal mass, right and went to the operating room on 06/15/2023 and underwent the above named procedures.    He was given perioperative antibiotics:  Anti-infectives (From admission, onward)    Start     Dose/Rate Route Frequency Ordered Stop   06/15/23 1043  ceFAZolin (ANCEF) IVPB 2g/100 mL premix        2 g 200 mL/hr over 30 Minutes Intravenous 30 min pre-op 06/15/23 1043 06/15/23 1244     .  He was given sequential compression devices, early ambulation for DVT prophylaxis.  He benefited maximally from the hospital stay and there were no complications.    Recent vital signs:  Vitals:   06/16/23 0619 06/16/23 0831  BP: 129/64 (!) 146/79  Pulse: 65 66  Resp: 20 14  Temp: 98.2 F (36.8 C) 98.3 F (36.8 C)  SpO2: 90% 91%    Recent laboratory studies:  Lab Results  Component Value Date   HGB 14.5 06/16/2023   HGB 15.1 06/15/2023   HGB 15.4 06/05/2023   Lab Results  Component Value Date   WBC 12.2 (H) 06/05/2023   PLT 280 06/05/2023   Lab Results  Component Value Date   INR 1.1 03/31/2023   Lab Results  Component Value Date   NA 133 (L) 06/16/2023   K 4.6 06/16/2023   CL 100 06/16/2023   CO2 25 06/16/2023   BUN 13 06/16/2023   CREATININE 1.09 06/16/2023   GLUCOSE 139 (H)  06/16/2023    Discharge Medications:   Allergies as of 06/16/2023   No Known Allergies      Medication List     STOP taking these medications    ibuprofen 200 MG tablet Commonly known as: ADVIL       TAKE these medications    olmesartan 40 MG tablet Commonly known as: BENICAR Take 40 mg by mouth daily.   rosuvastatin 20 MG tablet Commonly known as: CRESTOR Take 20 mg by mouth at bedtime.   traMADol 50 MG tablet Commonly known as: ULTRAM Take 50 mg by mouth every 6 (six) hours as needed for moderate pain (pain score 4-6).        Diagnostic Studies: VAS US CAROTID  Result Date: 06/13/2023 Carotid Arterial Duplex Study Patient Name:  Edward Lawson  Date of Exam:   06/13/2023 Medical Rec #: [id number]      Accession #:    [id number] Date of Birth: Dec 18, 1950     Patient Gender: M Patient Age:   41 years Exam Location:  [facility] Procedure:      VAS US CAROTID Referring Phys: Rhoderick Moody --------------------------------------------------------------------------------  Indications:       Bilateral bruits. Risk Factors:      Hypertension, current smoker. Comparison Study:  No previous exams Performing Technologist: Jody Hill RVT, RDMS  Examination Guidelines:  A complete evaluation includes B-mode imaging, spectral Doppler, color Doppler, and power Doppler as needed of all accessible portions of each vessel. Bilateral testing is considered an integral part of a complete examination. Limited examinations for reoccurring indications may be performed as noted.  Right Carotid Findings: +----------+--------+--------+--------+------------------+------------------+           PSV cm/sEDV cm/sStenosisPlaque DescriptionComments           +----------+--------+--------+--------+------------------+------------------+ CCA Prox  68      15                                intimal thickening +----------+--------+--------+--------+------------------+------------------+ CCA  Distal33      9               calcific          intimal thickening +----------+--------+--------+--------+------------------+------------------+ ICA Prox  44      11              calcific                             +----------+--------+--------+--------+------------------+------------------+ ICA Distal70      28                                                   +----------+--------+--------+--------+------------------+------------------+ ECA       79      10              calcific                             +----------+--------+--------+--------+------------------+------------------+ +----------+--------+-------+----------------+-------------------+           PSV cm/sEDV cmsDescribe        Arm Pressure (mmHG) +----------+--------+-------+----------------+-------------------+ [id number]             Multiphasic, WNL                    +----------+--------+-------+----------------+-------------------+ +---------+--------+--+--------+-+---------+ VertebralPSV cm/s27EDV cm/s9Antegrade +---------+--------+--+--------+-+---------+  Left Carotid Findings: +----------+-------+--------+--------+-----------------------+-----------------+           PSV    EDV cm/sStenosisPlaque Description     Comments                    cm/s                                                            +----------+-------+--------+--------+-----------------------+-----------------+ CCA Prox  77     16                                     intimal  thickening        +----------+-------+--------+--------+-----------------------+-----------------+ CCA Distal48     11              calcific               intimal                                                                   thickening        +----------+-------+--------+--------+-----------------------+-----------------+ ICA Prox  50     17       1-39%   heterogenous and                                                          calcific                                 +----------+-------+--------+--------+-----------------------+-----------------+ ICA Mid                          calcific and                                                              heterogenous                             +----------+-------+--------+--------+-----------------------+-----------------+ ICA Distal63     22                                                       +----------+-------+--------+--------+-----------------------+-----------------+ ECA       90     8                                                        +----------+-------+--------+--------+-----------------------+-----------------+ +----------+--------+--------+----------------+-------------------+           PSV cm/sEDV cm/sDescribe        Arm Pressure (mmHG) +----------+--------+--------+----------------+-------------------+ [id number]              Multiphasic, WNL                    +----------+--------+--------+----------------+-------------------+ +---------+--------+--+--------+--+---------+ VertebralPSV cm/s39EDV cm/s14Antegrade +---------+--------+--+--------+--+---------+   Summary: Right Carotid: The extracranial vessels were near-normal with only minimal wall                thickening or plaque. Left Carotid: Velocities in the left ICA are consistent with a 1-39% stenosis. Vertebrals:  Bilateral vertebral arteries demonstrate antegrade  flow. Subclavians: Normal flow hemodynamics were seen in bilateral subclavian              arteries. *See table(s) above for measurements and observations.  Electronically signed by Coral Else MD on 06/13/2023 at 9:28:30 PM.    Final     Disposition: Discharge disposition: 01-Home or Self Care          Follow-up Information     Rene Paci, MD Follow up on 06/29/2023.   Specialty:  Urology Why: at 9:15 Contact information: [address] [place] [address] [phone]                  Signed: Wilkie Aye 06/19/2023, 7:49 AM

## 2023-07-19 ENCOUNTER — Ambulatory Visit (HOSPITAL_COMMUNITY)
Admission: RE | Admit: 2023-07-19 | Discharge: 2023-07-19 | Disposition: A | Discharge status: Home / Self Care | Payer: Medicare HMO | Source: Ambulatory Visit | Attending: Physician Assistant | Admitting: Physician Assistant

## 2023-07-19 DIAGNOSIS — R918 Other nonspecific abnormal finding of lung field: Secondary | ICD-10-CM | POA: Diagnosis present

## 2023-07-19 MED ORDER — IOHEXOL 300 MG/ML  SOLN
75.0000 mL | Freq: Once | INTRAMUSCULAR | Status: AC | PRN
  Administered 2023-07-19: 70 mL via INTRAVENOUS

## 2023-07-26 ENCOUNTER — Inpatient Hospital Stay: Payer: Medicare HMO | Attending: Physician Assistant

## 2023-07-26 ENCOUNTER — Other Ambulatory Visit: Payer: Self-pay

## 2023-07-26 ENCOUNTER — Inpatient Hospital Stay: Payer: Medicare HMO | Admitting: Hematology

## 2023-07-26 VITALS — BP 144/72 | HR 50 | Temp 98.1°F | Resp 20 | Wt 186.4 lb

## 2023-07-26 DIAGNOSIS — M1990 Unspecified osteoarthritis, unspecified site: Secondary | ICD-10-CM | POA: Diagnosis not present

## 2023-07-26 DIAGNOSIS — G8929 Other chronic pain: Secondary | ICD-10-CM | POA: Diagnosis not present

## 2023-07-26 DIAGNOSIS — C641 Malignant neoplasm of right kidney, except renal pelvis: Secondary | ICD-10-CM | POA: Insufficient documentation

## 2023-07-26 DIAGNOSIS — Z79899 Other long term (current) drug therapy: Secondary | ICD-10-CM | POA: Diagnosis not present

## 2023-07-26 DIAGNOSIS — R918 Other nonspecific abnormal finding of lung field: Secondary | ICD-10-CM | POA: Insufficient documentation

## 2023-07-26 DIAGNOSIS — F17210 Nicotine dependence, cigarettes, uncomplicated: Secondary | ICD-10-CM | POA: Diagnosis not present

## 2023-07-26 LAB — IRON AND IRON BINDING CAPACITY (CC-WL,HP ONLY)
Iron: 72 ug/dL (ref 45–182)
Saturation Ratios: 21 % (ref 17.9–39.5)
TIBC: 340 ug/dL (ref 250–450)
UIBC: 268 ug/dL (ref 117–376)

## 2023-07-26 LAB — CBC WITH DIFFERENTIAL (CANCER CENTER ONLY)
Abs Immature Granulocytes: 0.04 10*3/uL (ref 0.00–0.07)
Basophils Absolute: 0.1 10*3/uL (ref 0.0–0.1)
Basophils Relative: 1 %
Eosinophils Absolute: 0.2 10*3/uL (ref 0.0–0.5)
Eosinophils Relative: 2 %
HCT: 43.2 % (ref 39.0–52.0)
Hemoglobin: 14.4 g/dL (ref 13.0–17.0)
Immature Granulocytes: 0 %
Lymphocytes Relative: 29 %
Lymphs Abs: 3.7 10*3/uL (ref 0.7–4.0)
MCH: 30.4 pg (ref 26.0–34.0)
MCHC: 33.3 g/dL (ref 30.0–36.0)
MCV: 91.1 fL (ref 80.0–100.0)
Monocytes Absolute: 0.9 10*3/uL (ref 0.1–1.0)
Monocytes Relative: 7 %
Neutro Abs: 7.7 10*3/uL (ref 1.7–7.7)
Neutrophils Relative %: 61 %
Platelet Count: 279 10*3/uL (ref 150–400)
RBC: 4.74 MIL/uL (ref 4.22–5.81)
RDW: 12.2 % (ref 11.5–15.5)
WBC Count: 12.7 10*3/uL — ABNORMAL HIGH (ref 4.0–10.5)
nRBC: 0 % (ref 0.0–0.2)

## 2023-07-26 LAB — CMP (CANCER CENTER ONLY)
ALT: 16 U/L (ref 0–44)
AST: 13 U/L — ABNORMAL LOW (ref 15–41)
Albumin: 4.1 g/dL (ref 3.5–5.0)
Alkaline Phosphatase: 99 U/L (ref 38–126)
Anion gap: 7 (ref 5–15)
BUN: 18 mg/dL (ref 8–23)
CO2: 26 mmol/L (ref 22–32)
Calcium: 9.1 mg/dL (ref 8.9–10.3)
Chloride: 102 mmol/L (ref 98–111)
Creatinine: 1 mg/dL (ref 0.61–1.24)
GFR, Estimated: >60 mL/min (ref >60)
Glucose, Bld: 114 mg/dL — ABNORMAL HIGH (ref 70–99)
Potassium: 4.2 mmol/L (ref 3.5–5.1)
Sodium: 135 mmol/L (ref 135–145)
Total Bilirubin: 0.7 mg/dL (ref <1.2)
Total Protein: 7.2 g/dL (ref 6.5–8.1)

## 2023-07-26 LAB — FERRITIN: Ferritin: 92 ng/mL (ref 24–336)

## 2023-07-26 LAB — LACTATE DEHYDROGENASE: LDH: 142 U/L (ref 98–192)
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
## 2023-08-01 DIAGNOSIS — C641 Malignant neoplasm of right kidney, except renal pelvis: Secondary | ICD-10-CM | POA: Insufficient documentation

## 2023-08-23 ENCOUNTER — Telehealth: Payer: Self-pay | Admitting: Hematology

## 2023-08-23 NOTE — Telephone Encounter (Signed)
Patient was called to follow-up from our previous medical oncology visit 07/16/2023 to discuss the final CT chest results as well as to follow-up regarding his decision about adjuvant immunotherapy with pembrolizumab for his stage III renal cell carcinoma.  We discussed as below that his CT chest showed bilateral pulmonary nodules which have been stable since his prior study. We discussed that we could order a follow-up CT scan in 6 months to continue to keep an eye on the lung nodules.  Patient reports that he would rather follow-up on the CT scans with his primary care physician and Dr. Sande Brothers his urologist.  He notes he has a follow-up with his urologist in the next month or so and is getting a CT of the abdomen and possibly the chest with him anyways.  We also followed up on his decision about adjuvant immunotherapy with pembrolizumab.  Patient previously was not keen on adjuvant immunotherapy and on follow-up today reiterates his decision to forego adjuvant pembrolizumab immunotherapy.  He understands that foregoing adjuvant immunotherapy could increase his risk of recurrence of his stage III renal cell carcinoma.  He is quite clear with his decision.  He notes that his back issues have been very limiting to his quality of life and he wants to focus on addressing those. He also notes that he is significantly burdened by medical costs and co-pays and wants to consolidate his care with his primary care physician and his urologist.  I discussed with him and showed him that we are available if any other questions or concerns arise with regards to his renal cell carcinoma.  He was strongly recommended to continue follow-up with his urologist and his primary care physician since he will need ongoing monitoring with H&P's and follow-up imaging studies for his lung nodules as well as abdomen.     CT Chest W Contrast Result Date: 08/01/2023 CLINICAL DATA:  Follow-up pulmonary nodules EXAM: CT CHEST  WITH CONTRAST TECHNIQUE: Multidetector CT imaging of the chest was performed during intravenous contrast administration. RADIATION DOSE REDUCTION: This exam was performed according to the departmental dose-optimization program which includes automated exposure control, adjustment of the mA and/or kV according to patient size and/or use of iterative reconstruction technique. CONTRAST:  70mL OMNIPAQUE IOHEXOL 300 MG/ML  SOLN COMPARISON:  PET CT 04/24/2023.  Chest CT 03/31/2023 FINDINGS: Cardiovascular: Heart is normal size. Aorta is normal caliber. Extensive 3 vessel coronary artery disease and aortic atherosclerosis. Mediastinum/Nodes: No mediastinal, hilar, or axillary adenopathy. Trachea and esophagus are unremarkable. Thyroid unremarkable. Lungs/Pleura: Subpleural left upper lobe nodule measures up to 11 mm on image 78/6, stable. Lingular nodule measures 9 mm on image 97/6, stable. Right lower lobe nodule on image 84/6 measures 9 mm compared 8 mm previously. No new pulmonary nodules. Moderate centrilobular and paraseptal emphysema. No effusions. Upper Abdomen: No acute findings Musculoskeletal: Chest wall soft tissues are unremarkable. No acute bony abnormality. IMPRESSION: Bilateral pulmonary nodules measuring up to 11 mm. These are stable since prior study. These could be followed with repeat CT in 6 months. No new or enlarging pulmonary nodules. Extensive 3 vessel coronary artery disease. Aortic Atherosclerosis (ICD10-I70.0) and Emphysema (ICD10-J43.9). Electronically Signed   By: Charlett Nose M.D.   On: 08/01/2023 21:37

## 2023-09-29 LAB — COLOGUARD: COLOGUARD: NEGATIVE

## 2024-03-19 NOTE — Progress Notes (Signed)
 Subjective   Patient ID:  Edward Lawson is a 73 y.o. (DOB 1951/01/23) male.      Patient presents with  . Medicare Wellness Visit    Medicare wellness visit no other concerns    Dental: UTD has dentures Eye: no Tobacco  Tobacco Use: High Risk (03/19/2024)   Patient History   . Smoking Tobacco Use: Every Day   . Smokeless Tobacco Use: Never   . Passive Exposure: Current   Alcohol occasional Diet no Exercise working Mood no concerns Sleep no concerns Skin no concerns, think skin Nocturia 1x Vaccines  Care Gaps Concerns  HTN. Compliant with medications. No side effects. Did not take meds today HLD. Compliant with medications. No side effects Hx Right nephrectomy for renal cell ca. Released by urology Chronic low back pain and ankle pain when raining. Stays in low back. Has compression fracture. Saw Dr Rosella Garner Social History[1] Family History  Problem Relation Age of Onset  . Diabetes Mother    Past Medical History:  Diagnosis Date  . Hypertension    Review of Systems is complete and negative except as noted.  Objective   BP (!) 130/100 (BP Location: Right Upper Arm, Patient Position: Sitting)   Pulse 74   Temp 98.3 F (36.8 C) (Oral)   Resp 16   Ht 5' 10 (1.778 m)   Wt 191 lb (86.6 kg)   SpO2 97%   BMI 27.41 kg/m  General:  Well developed, Well nourished, No distress HEENT: Normocephalic, atraumatic; Pupils equal, round, reactive to light and accommodation; Conjunctiva normal; Bilateral external auditory canals and tympanic membranes normal; Nares normal; Oropharynx moist and clear Neck:  Supple No lymphadenopathy. No bruit CV:  S1S2, Regular rate and rhythm, with 2/6 murmur at LLSB Lungs:  Clear to auscultation bilaterally with normal effort Skin:  No focal rashes  Extremities:  No clubbing, cyanosis or edema.   Neuro:  No focal deficits; Cranial nerves II-XII intact     Impression   1. Medicare annual wellness visit, subsequent   2.  Annual physical exam   3. Hypertension, unspecified type   4. Hyperlipidemia, unspecified hyperlipidemia type   5. Coronary artery disease involving native heart without angina pectoris, unspecified vessel or lesion type   6. Renal cell carcinoma of right kidney (*)   7. BPH associated with nocturia   8. Chronic midline low back pain without sciatica     Plan  Reviewed Reviewed goals of BP, weight/BMI and AHA recommendations diet, exercise and alcohol. Check labs. Encourage smoking cessation BP elevated today. Did not take meds Stable. Continue present medical management. Check labs Reviewed stress test. Start asa daily. Asymptomatic. Encourage smoking cessation S/p nephrectomy. Released by urology per pt Check labs Hx compression fracture. On tramadol  100mg  daily and doing well. Knows he must be seen every 6 months   Patient's Medications       * Accurate as of March 19, 2024 12:32 PM. Reflects encounter med changes as of last refresh          New Prescriptions      Instructions  traMADol  50 mg tablet Commonly known as: ULTRAM  Started by: Lamar Beam  100 mg, Oral, Daily       Continued Medications      Instructions  ibuprofen  800 mg tablet Commonly known as: ADVIL ,MOTRIN   800 mg, Daily   olmesartan medoxomil 40 mg tablet Commonly known as: BENICAR  40 mg, Oral, Daily   rosuvastatin calcium 20  mg tablet Commonly known as: CRESTOR  20 mg, Oral, At bedtime        Orders Placed This Encounter  Procedures  . CBC And Differential  . Comprehensive Metabolic Panel  . Lipid Panel  . PSA    Risks, benefits, and alternatives of the medications and treatment plan prescribed today were discussed, and patient expressed understanding. Plan follow-up as discussed or as needed if any worsening symptoms or change in condition.           Medicare AWV  Edward Lawson is a 73 y.o. male who presents for his subsequent annual wellness visit for Medicare.   Clinical documentation was reviewed and is accessible via encounter-level attachments.   reviewed Any physical exam components or additional concerns beyond the scope of the Annual Wellness Visit may be documented in a separate note within this encounter.  Medicare Required Components     Reviewed and updated this visit by provider: PDMP       Medicare required assessment: Future risk of substance use disorder / overdose risk of 27% is higher (>=20%).    Patient Care Team: Lamar MARLA Cornet, MD as PCP - General (Family Medicine)  Vitals   Vitals:   03/19/24 1123  BP: (!) 130/100  Patient Position: Sitting  Pulse: 74  Temp: 98.3 F (36.8 C)  TempSrc: Oral  Resp: 16  Height: 5' 10 (1.778 m)  Weight: 191 lb (86.6 kg)  SpO2: 97%  BMI (Calculated): 27.4    Disposition   1. Medicare annual wellness visit, subsequent (Primary) 2. Annual physical exam 3. Hypertension, unspecified type -     CBC And Differential; Future; Expected date: 03/19/2024 -     Comprehensive Metabolic Panel; Future 4. Hyperlipidemia, unspecified hyperlipidemia type -     Lipid Panel; Future; Expected date: 04/30/2024 5. Coronary artery disease involving native heart without angina pectoris, unspecified vessel or lesion type 6. Renal cell carcinoma of right kidney (*) 7. BPH associated with nocturia -     PSA; Future 8. Chronic midline low back pain without sciatica Other orders -     traMADol  (ULTRAM ) 50 mg tablet; Take two tablets (100 mg dose) by mouth daily. Max Daily Amount: 100 mg, Starting Wed 03/19/2024, Until Thu 03/19/2025, Normal   Follow up in about 6 months (around 09/19/2024).   Health maintenance issues were discussed with the patient.  A written plan was provided to the patient in the form of patient instructions in the After Visit Summary document.          [1] Social History Tobacco Use  . Smoking status: Every Day    Current packs/day: 1.50    Average packs/day: 1.5  packs/day for 56.6 years (84.9 ttl pk-yrs)    Types: Cigarettes    Start date: 08/08/1967    Passive exposure: Current  . Smokeless tobacco: Never  Vaping Use  . Vaping status: Never Used  Substance Use Topics  . Alcohol use: Yes    Alcohol/week: 2.0 - 4.0 standard drinks of alcohol    Types: 2 - 4 Standard drinks or equivalent per week  . Drug use: Never  *Some images could not be shown.

## 2024-06-30 ENCOUNTER — Encounter: Payer: Self-pay | Admitting: Oncology
